# Patient Record
Sex: Male | Born: 1970 | Race: Black or African American | Hispanic: No | Marital: Married | Smoking: Current every day smoker
Health system: Southern US, Community
[De-identification: ages and names within clinical notes are randomized; demographics above are authoritative.]

## PROBLEM LIST (undated history)

## (undated) DIAGNOSIS — E785 Hyperlipidemia, unspecified: Secondary | ICD-10-CM

## (undated) DIAGNOSIS — E119 Type 2 diabetes mellitus without complications: Secondary | ICD-10-CM

## (undated) DIAGNOSIS — I10 Essential (primary) hypertension: Secondary | ICD-10-CM

## (undated) DIAGNOSIS — D649 Anemia, unspecified: Secondary | ICD-10-CM

## (undated) DIAGNOSIS — G4730 Sleep apnea, unspecified: Secondary | ICD-10-CM

## (undated) HISTORY — DX: Type 2 diabetes mellitus without complications: E11.9

## (undated) HISTORY — PX: ELBOW FRACTURE SURGERY: SHX616

## (undated) HISTORY — DX: Essential (primary) hypertension: I10

## (undated) HISTORY — DX: Hyperlipidemia, unspecified: E78.5

## (undated) HISTORY — PX: CHOLECYSTECTOMY: SHX55

---

## 2011-09-27 ENCOUNTER — Ambulatory Visit: Payer: Self-pay | Admitting: Internal Medicine

## 2020-01-09 ENCOUNTER — Encounter: Payer: Self-pay | Admitting: Gastroenterology

## 2020-03-05 ENCOUNTER — Encounter: Payer: Self-pay | Admitting: Gastroenterology

## 2020-03-30 DIAGNOSIS — U071 COVID-19: Secondary | ICD-10-CM

## 2020-03-30 HISTORY — DX: COVID-19: U07.1

## 2020-04-29 ENCOUNTER — Encounter: Payer: Self-pay | Admitting: Gastroenterology

## 2020-05-15 ENCOUNTER — Ambulatory Visit (AMBULATORY_SURGERY_CENTER): Payer: Self-pay | Admitting: *Deleted

## 2020-05-15 VITALS — Ht 73.0 in | Wt 260.0 lb

## 2020-05-15 DIAGNOSIS — Z1211 Encounter for screening for malignant neoplasm of colon: Secondary | ICD-10-CM

## 2020-05-15 NOTE — Progress Notes (Signed)
No egg or soy allergy known to patient  No issues with past sedation with any surgeries or procedures No intubation problems in the past  No FH of Malignant Hyperthermia No diet pills per patient No home 02 use per patient  No blood thinners per patient  Pt denies issues with constipation  No A fib or A flutter  EMMI video to pt or via MyChart   PV completed virtually Pt is fully vaccinated  for Covid  Pt denies loose or missing teeth, denies dentures, partials, dental implants, capped or bonded teeth  Miralax prep given.  Due to the COVID-19 pandemic we are asking patients to follow certain guidelines.  Pt aware of COVID protocols and [facility] guidelines      .

## 2020-05-29 ENCOUNTER — Other Ambulatory Visit: Payer: Self-pay

## 2020-05-29 ENCOUNTER — Ambulatory Visit (AMBULATORY_SURGERY_CENTER): Payer: PRIVATE HEALTH INSURANCE | Admitting: Gastroenterology

## 2020-05-29 ENCOUNTER — Encounter: Payer: Self-pay | Admitting: Gastroenterology

## 2020-05-29 VITALS — BP 102/78 | HR 68 | Temp 97.3°F | Resp 11 | Ht 73.0 in | Wt 260.0 lb

## 2020-05-29 DIAGNOSIS — Z1211 Encounter for screening for malignant neoplasm of colon: Secondary | ICD-10-CM

## 2020-05-29 MED ORDER — SODIUM CHLORIDE 0.9 % IV SOLN: 500.0000 mL | Freq: Once | INTRAVENOUS | Status: DC

## 2020-05-29 NOTE — Progress Notes (Signed)
Report given to PACU, vss 

## 2020-05-29 NOTE — Patient Instructions (Signed)
Information on hemorrhoids given to you today.  Resume previous diet and medication.  Repeat colonoscopy in 10 year.  YOU HAD AN ENDOSCOPIC PROCEDURE TODAY AT THE [facility]:   Refer to the procedure report that was given to you for any specific questions about what was found during the examination.  If the procedure report does not answer your questions, please call your gastroenterologist to clarify.  If you requested that your care partner not be given the details of your procedure findings, then the procedure report has been included in a sealed envelope for you to review at your convenience later.  YOU SHOULD EXPECT: Some feelings of bloating in the abdomen. Passage of more gas than usual.  Walking can help get rid of the air that was put into your GI tract during the procedure and reduce the bloating. If you had a lower endoscopy (such as a colonoscopy or flexible sigmoidoscopy) you may notice spotting of blood in your stool or on the toilet paper. If you underwent a bowel prep for your procedure, you may not have a normal bowel movement for a few days.  Please Note:  You might notice some irritation and congestion in your nose or some drainage.  This is from the oxygen used during your procedure.  There is no need for concern and it should clear up in a day or so.  SYMPTOMS TO REPORT IMMEDIATELY:   Following lower endoscopy (colonoscopy or flexible sigmoidoscopy):  Excessive amounts of blood in the stool  Significant tenderness or worsening of abdominal pains  Swelling of the abdomen that is new, acute  Fever of 100F or higher  For urgent or emergent issues, a gastroenterologist can be reached at any hour by calling [phone]. Do not use MyChart messaging for urgent concerns.    DIET:  We do recommend a small meal at first, but then you may proceed to your regular diet.  Drink plenty of fluids but you should avoid alcoholic beverages for 24 hours.  ACTIVITY:  You  should plan to take it easy for the rest of today and you should NOT DRIVE or use heavy machinery until tomorrow (because of the sedation medicines used during the test).    FOLLOW UP: Our staff will call the number listed on your records 48-72 hours following your procedure to check on you and address any questions or concerns that you may have regarding the information given to you following your procedure. If we do not reach you, we will leave a message.  We will attempt to reach you two times.  During this call, we will ask if you have developed any symptoms of COVID 19. If you develop any symptoms (ie: fever, flu-like symptoms, shortness of breath, cough etc.) before then, please call [phone].  If you test positive for Covid 19 in the 2 weeks post procedure, please call and report this information to [place].    If any biopsies were taken you will be contacted by phone or by letter within the next 1-3 weeks.  Please call us at [phone] if you have not heard about the biopsies in 3 weeks.    SIGNATURES/CONFIDENTIALITY: You and/or your care partner have signed paperwork which will be entered into your electronic medical record.  These signatures attest to the fact that that the information above on your After Visit Summary has been reviewed and is understood.  Full responsibility of the confidentiality of this discharge information lies with you and/or your  care-partner.

## 2020-05-29 NOTE — Op Note (Signed)
[facility] Patient Name: Mark Powers Procedure Date: 05/29/2020 9:16 AM MRN: [id number] Endoscopist: Rachael Fee , MD Age: 50 Referring MD:  Date of Birth: 1970-07-09 Gender: Male Account #: [id number] Procedure:                Colonoscopy Indications:              Screening for colorectal malignant neoplasm Medicines:                Monitored Anesthesia Care Procedure:                Pre-Anesthesia Assessment:                           - Prior to the procedure, a History and Physical                            was performed, and patient medications and                            allergies were reviewed. The patient's tolerance of                            previous anesthesia was also reviewed. The risks                            and benefits of the procedure and the sedation                            options and risks were discussed with the patient.                            All questions were answered, and informed consent                            was obtained. Prior Anticoagulants: The patient has                            taken no previous anticoagulant or antiplatelet                            agents. ASA Grade Assessment: II - A patient with                            mild systemic disease. After reviewing the risks                            and benefits, the patient was deemed in                            satisfactory condition to undergo the procedure.                           After obtaining informed consent, the colonoscope  was passed under direct vision. Throughout the                            procedure, the patient's blood pressure, pulse, and                            oxygen saturations were monitored continuously. The                            Olympus CF-HQ190 [id number]) Colonoscope was                            introduced through the anus and advanced to the the                            cecum, identified by  appendiceal orifice and                            ileocecal valve. The colonoscopy was performed                            without difficulty. The patient tolerated the                            procedure well. The quality of the bowel                            preparation was good. The ileocecal valve,                            appendiceal orifice, and rectum were photographed. Scope In: 9:22:25 AM Scope Out: 9:32:29 AM Scope Withdrawal Time: 0 hours 6 minutes 58 seconds  Total Procedure Duration: 0 hours 10 minutes 4 seconds  Findings:                 Internal hemorrhoids were found. The hemorrhoids                            were small.                           The exam was otherwise without abnormality on                            direct and retroflexion views. Complications:            No immediate complications. Estimated blood loss:                            None. Estimated Blood Loss:     Estimated blood loss: none. Impression:               - Internal hemorrhoids.                           - The examination was otherwise normal on direct  and retroflexion views.                           - No polyps or cancers. Recommendation:           - Patient has a contact number available for                            emergencies. The signs and symptoms of potential                            delayed complications were discussed with the                            patient. Return to normal activities tomorrow.                            Written discharge instructions were provided to the                            patient.                           - Resume previous diet.                           - Continue present medications.                           - Repeat colonoscopy in 10 years for screening. Rachael Fee, MD 05/29/2020 9:34:01 AM This report has been signed electronically.

## 2020-05-29 NOTE — Progress Notes (Signed)
[facility]  Pt's states no medical or surgical changes since previsit or office visit.  

## 2020-05-31 ENCOUNTER — Telehealth: Payer: Self-pay | Admitting: *Deleted

## 2020-05-31 NOTE — Telephone Encounter (Signed)
Follow up call made. 

## 2020-05-31 NOTE — Telephone Encounter (Signed)
Second follow up call made. 

## 2021-03-11 DIAGNOSIS — I4510 Unspecified right bundle-branch block: Secondary | ICD-10-CM

## 2021-04-25 ENCOUNTER — Ambulatory Visit: Payer: Self-pay | Admitting: Student

## 2021-04-25 ENCOUNTER — Encounter (HOSPITAL_COMMUNITY): Payer: Self-pay | Admitting: Student

## 2021-04-25 ENCOUNTER — Other Ambulatory Visit: Payer: Self-pay

## 2021-04-25 NOTE — Progress Notes (Signed)
Spoke with pt and his wife, Carollee Herter for pre-op call. Pt states he is treated for HTN and Type 2 diabetes. Denies cardiac history. Pt states his last A1C was 6.4 in November 2022. Pt states he rarely checks his blood sugar. States he does have a CBG meter. Instructed pt not to take his Jardiance Sunday and Monday AM and not take Metformin Monday AM. Instructed him to check his blood sugar when he wakes up Monday AM.  If blood sugar is 70 or below, treat with 1/2 cup of clear juice (apple or cranberry) and recheck blood sugar 15 minutes after drinking juice. If blood sugar continues to be 70 or below, call the Short Stay department and ask to speak to a nurse. He voiced understanding.   Pt will need a Covid test on arrival.

## 2021-04-27 DIAGNOSIS — S42412G Displaced simple supracondylar fracture without intercondylar fracture of left humerus, subsequent encounter for fracture with delayed healing: Secondary | ICD-10-CM
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
## 2021-04-28 ENCOUNTER — Inpatient Hospital Stay (HOSPITAL_COMMUNITY)
Admission: RE | Admit: 2021-04-28 | Discharge: 2021-05-01 | DRG: 493 | Disposition: A | Discharge status: Home Health | Payer: PRIVATE HEALTH INSURANCE | Attending: Student | Admitting: Student

## 2021-04-28 ENCOUNTER — Encounter (HOSPITAL_COMMUNITY): Payer: Self-pay | Admitting: Student

## 2021-04-28 ENCOUNTER — Inpatient Hospital Stay (HOSPITAL_COMMUNITY): Payer: PRIVATE HEALTH INSURANCE

## 2021-04-28 ENCOUNTER — Inpatient Hospital Stay (HOSPITAL_COMMUNITY): Payer: PRIVATE HEALTH INSURANCE | Admitting: Certified Registered"

## 2021-04-28 ENCOUNTER — Other Ambulatory Visit: Payer: Self-pay

## 2021-04-28 ENCOUNTER — Encounter (HOSPITAL_COMMUNITY)
Admission: RE | Disposition: A | Discharge status: Home Health | Payer: Self-pay | Source: Home / Self Care | Attending: Student

## 2021-04-28 DIAGNOSIS — M869 Osteomyelitis, unspecified: Secondary | ICD-10-CM | POA: Diagnosis present

## 2021-04-28 DIAGNOSIS — F17210 Nicotine dependence, cigarettes, uncomplicated: Secondary | ICD-10-CM | POA: Diagnosis present

## 2021-04-28 DIAGNOSIS — Z8616 Personal history of COVID-19: Secondary | ICD-10-CM | POA: Diagnosis not present

## 2021-04-28 DIAGNOSIS — D62 Acute posthemorrhagic anemia: Secondary | ICD-10-CM | POA: Diagnosis not present

## 2021-04-28 DIAGNOSIS — Z419 Encounter for procedure for purposes other than remedying health state, unspecified: Secondary | ICD-10-CM

## 2021-04-28 DIAGNOSIS — I10 Essential (primary) hypertension: Secondary | ICD-10-CM | POA: Diagnosis present

## 2021-04-28 DIAGNOSIS — T148XXA Other injury of unspecified body region, initial encounter: Secondary | ICD-10-CM

## 2021-04-28 DIAGNOSIS — T84611A Infection and inflammatory reaction due to internal fixation device of left humerus, initial encounter: Secondary | ICD-10-CM | POA: Diagnosis present

## 2021-04-28 DIAGNOSIS — X58XXXA Exposure to other specified factors, initial encounter: Secondary | ICD-10-CM | POA: Diagnosis present

## 2021-04-28 DIAGNOSIS — S42412G Displaced simple supracondylar fracture without intercondylar fracture of left humerus, subsequent encounter for fracture with delayed healing: Secondary | ICD-10-CM

## 2021-04-28 DIAGNOSIS — E1142 Type 2 diabetes mellitus with diabetic polyneuropathy: Secondary | ICD-10-CM | POA: Diagnosis present

## 2021-04-28 DIAGNOSIS — G5622 Lesion of ulnar nerve, left upper limb: Secondary | ICD-10-CM | POA: Diagnosis present

## 2021-04-28 DIAGNOSIS — Z7984 Long term (current) use of oral hypoglycemic drugs: Secondary | ICD-10-CM | POA: Diagnosis not present

## 2021-04-28 DIAGNOSIS — S42492A Other displaced fracture of lower end of left humerus, initial encounter for closed fracture: Secondary | ICD-10-CM | POA: Diagnosis present

## 2021-04-28 DIAGNOSIS — Z79899 Other long term (current) drug therapy: Secondary | ICD-10-CM

## 2021-04-28 DIAGNOSIS — K5900 Constipation, unspecified: Secondary | ICD-10-CM | POA: Diagnosis present

## 2021-04-28 DIAGNOSIS — T847XXA Infection and inflammatory reaction due to other internal orthopedic prosthetic devices, implants and grafts, initial encounter: Secondary | ICD-10-CM

## 2021-04-28 DIAGNOSIS — E785 Hyperlipidemia, unspecified: Secondary | ICD-10-CM | POA: Diagnosis present

## 2021-04-28 DIAGNOSIS — Y792 Prosthetic and other implants, materials and accessory orthopedic devices associated with adverse incidents: Secondary | ICD-10-CM | POA: Diagnosis present

## 2021-04-28 DIAGNOSIS — S52122A Displaced fracture of head of left radius, initial encounter for closed fracture: Secondary | ICD-10-CM

## 2021-04-28 HISTORY — PX: HARDWARE REMOVAL: SHX979

## 2021-04-28 HISTORY — DX: Sleep apnea, unspecified: G47.30

## 2021-04-28 HISTORY — DX: Anemia, unspecified: D64.9

## 2021-04-28 HISTORY — PX: I & D EXTREMITY: SHX5045

## 2021-04-28 LAB — BASIC METABOLIC PANEL
Anion gap: 10 (ref 5–15)
BUN: 15 mg/dL (ref 6–20)
CO2: 25 mmol/L (ref 22–32)
Calcium: 8.9 mg/dL (ref 8.9–10.3)
Chloride: 100 mmol/L (ref 98–111)
Creatinine, Ser: 0.82 mg/dL (ref 0.61–1.24)
GFR, Estimated: >60 mL/min (ref >60)
Glucose, Bld: 158 mg/dL — ABNORMAL HIGH (ref 70–99)
Potassium: 3.7 mmol/L (ref 3.5–5.1)
Sodium: 135 mmol/L (ref 135–145)

## 2021-04-28 LAB — CBC WITH DIFFERENTIAL/PLATELET
Abs Immature Granulocytes: 0.01 10*3/uL (ref 0.00–0.07)
Basophils Absolute: 0 10*3/uL (ref 0.0–0.1)
Basophils Relative: 0 %
Eosinophils Absolute: 0.1 10*3/uL (ref 0.0–0.5)
Eosinophils Relative: 1 %
HCT: 37.2 % — ABNORMAL LOW (ref 39.0–52.0)
Hemoglobin: 11.6 g/dL — ABNORMAL LOW (ref 13.0–17.0)
Immature Granulocytes: 0 %
Lymphocytes Relative: 39 %
Lymphs Abs: 2.4 10*3/uL (ref 0.7–4.0)
MCH: 28.3 pg (ref 26.0–34.0)
MCHC: 31.2 g/dL (ref 30.0–36.0)
MCV: 90.7 fL (ref 80.0–100.0)
Monocytes Absolute: 0.6 10*3/uL (ref 0.1–1.0)
Monocytes Relative: 10 %
Neutro Abs: 3.1 10*3/uL (ref 1.7–7.7)
Neutrophils Relative %: 50 %
Platelets: 387 10*3/uL (ref 150–400)
RBC: 4.1 MIL/uL — ABNORMAL LOW (ref 4.22–5.81)
RDW: 15.1 % (ref 11.5–15.5)
WBC: 6.3 10*3/uL (ref 4.0–10.5)
nRBC: 0 % (ref 0.0–0.2)

## 2021-04-28 LAB — SARS CORONAVIRUS 2 BY RT PCR (HOSPITAL ORDER, PERFORMED IN ~~LOC~~ HOSPITAL LAB): SARS Coronavirus 2: NEGATIVE

## 2021-04-28 LAB — GLUCOSE, CAPILLARY
Glucose-Capillary: 170 mg/dL — ABNORMAL HIGH (ref 70–99)
Glucose-Capillary: 136 mg/dL — ABNORMAL HIGH (ref 70–99)
Glucose-Capillary: 168 mg/dL — ABNORMAL HIGH (ref 70–99)
Glucose-Capillary: 181 mg/dL — ABNORMAL HIGH (ref 70–99)

## 2021-04-28 LAB — PROTIME-INR
INR: 0.9 (ref 0.8–1.2)
Prothrombin Time: 12.3 seconds (ref 11.4–15.2)

## 2021-04-28 LAB — C-REACTIVE PROTEIN: CRP: 1 mg/dL — ABNORMAL HIGH (ref <1.0)

## 2021-04-28 LAB — HEMOGLOBIN A1C
Hgb A1c MFr Bld: 5.8 % — ABNORMAL HIGH (ref 4.8–5.6)
Mean Plasma Glucose: 119.76 mg/dL

## 2021-04-28 LAB — VITAMIN D 25 HYDROXY (VIT D DEFICIENCY, FRACTURES): Vit D, 25-Hydroxy: 34.13 ng/mL (ref 30–100)

## 2021-04-28 LAB — SEDIMENTATION RATE: Sed Rate: 71 mm/hr — ABNORMAL HIGH (ref 0–16)

## 2021-04-28 SURGERY — IRRIGATION AND DEBRIDEMENT EXTREMITY
Anesthesia: General | Site: Elbow | Laterality: Left

## 2021-04-28 MED ORDER — FENTANYL CITRATE (PF) 250 MCG/5ML IJ SOLN
INTRAMUSCULAR | Status: AC
  Filled 2021-04-28: qty 5

## 2021-04-28 MED ORDER — DIPHENHYDRAMINE HCL 12.5 MG/5ML PO ELIX
12.5000 mg | ORAL_SOLUTION | ORAL | Status: DC | PRN
  Filled 2021-04-28: qty 10

## 2021-04-28 MED ORDER — CHLORHEXIDINE GLUCONATE 0.12 % MT SOLN
15.0000 mL | Freq: Once | OROMUCOSAL | Status: AC
  Administered 2021-04-28: 15 mL via OROMUCOSAL

## 2021-04-28 MED ORDER — ROCURONIUM BROMIDE 100 MG/10ML IV SOLN
INTRAVENOUS | Status: DC | PRN
  Administered 2021-04-28: 20 mg via INTRAVENOUS
  Administered 2021-04-28: 70 mg via INTRAVENOUS

## 2021-04-28 MED ORDER — SUCCINYLCHOLINE CHLORIDE 200 MG/10ML IV SOSY
PREFILLED_SYRINGE | INTRAVENOUS | Status: DC | PRN
  Administered 2021-04-28: 160 mg via INTRAVENOUS

## 2021-04-28 MED ORDER — HYDROCHLOROTHIAZIDE 12.5 MG PO TABS
12.5000 mg | ORAL_TABLET | Freq: Every day | ORAL | Status: DC
  Administered 2021-04-29 – 2021-05-01 (×3): 12.5 mg via ORAL
  Filled 2021-04-28 (×4): qty 1

## 2021-04-28 MED ORDER — IRBESARTAN 150 MG PO TABS
150.0000 mg | ORAL_TABLET | Freq: Every day | ORAL | Status: DC
  Administered 2021-04-29 – 2021-05-01 (×3): 150 mg via ORAL
  Filled 2021-04-28 (×4): qty 1

## 2021-04-28 MED ORDER — LACTATED RINGERS IV SOLN: INTRAVENOUS | Status: DC

## 2021-04-28 MED ORDER — ACETAMINOPHEN 500 MG PO TABS: 1000.0000 mg | ORAL_TABLET | Freq: Once | ORAL | Status: DC | PRN

## 2021-04-28 MED ORDER — VANCOMYCIN HCL 1500 MG/300ML IV SOLN
1500.0000 mg | Freq: Two times a day (BID) | INTRAVENOUS | Status: DC
  Administered 2021-04-29: 1500 mg via INTRAVENOUS
  Filled 2021-04-28: qty 300

## 2021-04-28 MED ORDER — ONDANSETRON HCL 4 MG/2ML IJ SOLN
4.0000 mg | Freq: Four times a day (QID) | INTRAMUSCULAR | Status: DC | PRN
  Filled 2021-04-28: qty 2

## 2021-04-28 MED ORDER — ONDANSETRON HCL 4 MG/2ML IJ SOLN
INTRAMUSCULAR | Status: DC | PRN
  Administered 2021-04-28: 4 mg via INTRAVENOUS

## 2021-04-28 MED ORDER — LIDOCAINE HCL (CARDIAC) PF 100 MG/5ML IV SOSY
PREFILLED_SYRINGE | INTRAVENOUS | Status: DC | PRN
Start: 2021-04-28 — End: 2021-04-28
  Administered 2021-04-28: 60 mg via INTRATRACHEAL

## 2021-04-28 MED ORDER — PROPOFOL 10 MG/ML IV BOLUS
INTRAVENOUS | Status: DC | PRN
  Administered 2021-04-28: 20 mg via INTRAVENOUS
  Administered 2021-04-28: 160 mg via INTRAVENOUS
  Administered 2021-04-28: 20 mg via INTRAVENOUS

## 2021-04-28 MED ORDER — TOBRAMYCIN SULFATE 1.2 G IJ SOLR
INTRAMUSCULAR | Status: AC
  Filled 2021-04-28: qty 1.2

## 2021-04-28 MED ORDER — ICOSAPENT ETHYL 1 G PO CAPS
2.0000 g | ORAL_CAPSULE | Freq: Two times a day (BID) | ORAL | Status: DC
  Administered 2021-04-28 – 2021-05-01 (×5): 2 g via ORAL
  Filled 2021-04-28 (×8): qty 2

## 2021-04-28 MED ORDER — ENOXAPARIN SODIUM 40 MG/0.4ML IJ SOSY
40.0000 mg | PREFILLED_SYRINGE | INTRAMUSCULAR | Status: DC
  Administered 2021-04-29 – 2021-05-01 (×2): 40 mg via SUBCUTANEOUS
  Filled 2021-04-28 (×4): qty 0.4

## 2021-04-28 MED ORDER — GABAPENTIN 300 MG PO CAPS
300.0000 mg | ORAL_CAPSULE | Freq: Two times a day (BID) | ORAL | Status: DC
  Administered 2021-04-28 – 2021-05-01 (×5): 300 mg via ORAL
  Filled 2021-04-28 (×6): qty 1

## 2021-04-28 MED ORDER — CEFTRIAXONE SODIUM 2 G IJ SOLR
2.0000 g | INTRAMUSCULAR | Status: DC
  Administered 2021-04-28: 2 g via INTRAVENOUS
  Filled 2021-04-28: qty 20

## 2021-04-28 MED ORDER — 0.9 % SODIUM CHLORIDE (POUR BTL) OPTIME
TOPICAL | Status: DC | PRN
  Administered 2021-04-28: 1000 mL

## 2021-04-28 MED ORDER — VANCOMYCIN HCL 1000 MG IV SOLR
INTRAVENOUS | Status: AC
  Filled 2021-04-28: qty 20

## 2021-04-28 MED ORDER — ACETAMINOPHEN 10 MG/ML IV SOLN
INTRAVENOUS | Status: AC
  Filled 2021-04-28: qty 100

## 2021-04-28 MED ORDER — ACETAMINOPHEN 10 MG/ML IV SOLN: 1000.0000 mg | Freq: Once | INTRAVENOUS | Status: DC | PRN

## 2021-04-28 MED ORDER — DOCUSATE SODIUM 100 MG PO CAPS
100.0000 mg | ORAL_CAPSULE | Freq: Two times a day (BID) | ORAL | Status: DC
  Administered 2021-04-28 – 2021-05-01 (×7): 100 mg via ORAL
  Filled 2021-04-28 (×7): qty 1

## 2021-04-28 MED ORDER — AMLODIPINE BESYLATE 5 MG PO TABS
5.0000 mg | ORAL_TABLET | Freq: Every day | ORAL | Status: DC
  Administered 2021-04-29 – 2021-05-01 (×3): 5 mg via ORAL
  Filled 2021-04-28 (×4): qty 1

## 2021-04-28 MED ORDER — TOBRAMYCIN SULFATE 1.2 G IJ SOLR
INTRAMUSCULAR | Status: DC | PRN
  Administered 2021-04-28: 1.2 g via TOPICAL

## 2021-04-28 MED ORDER — METHOCARBAMOL 500 MG PO TABS
ORAL_TABLET | ORAL | Status: AC
  Filled 2021-04-28: qty 1

## 2021-04-28 MED ORDER — OXYCODONE HCL 5 MG PO TABS: 5.0000 mg | ORAL_TABLET | Freq: Once | ORAL | Status: DC | PRN

## 2021-04-28 MED ORDER — FENTANYL CITRATE (PF) 100 MCG/2ML IJ SOLN
25.0000 ug | INTRAMUSCULAR | Status: DC | PRN
  Administered 2021-04-28 (×2): 25 ug via INTRAVENOUS

## 2021-04-28 MED ORDER — OXYCODONE HCL 5 MG/5ML PO SOLN: 5.0000 mg | Freq: Once | ORAL | Status: DC | PRN

## 2021-04-28 MED ORDER — OXYCODONE HCL 5 MG PO TABS
ORAL_TABLET | ORAL | Status: AC
  Filled 2021-04-28: qty 2

## 2021-04-28 MED ORDER — FENTANYL CITRATE (PF) 100 MCG/2ML IJ SOLN
INTRAMUSCULAR | Status: DC | PRN
  Administered 2021-04-28 (×4): 50 ug via INTRAVENOUS
  Administered 2021-04-28: 100 ug via INTRAVENOUS
  Administered 2021-04-28: 50 ug via INTRAVENOUS
  Administered 2021-04-28: 100 ug via INTRAVENOUS
  Administered 2021-04-28: 50 ug via INTRAVENOUS

## 2021-04-28 MED ORDER — HYDROMORPHONE HCL 1 MG/ML IJ SOLN
INTRAMUSCULAR | Status: DC | PRN
  Administered 2021-04-28 (×2): .5 mg via INTRAVENOUS

## 2021-04-28 MED ORDER — METOCLOPRAMIDE HCL 5 MG PO TABS
5.0000 mg | ORAL_TABLET | Freq: Three times a day (TID) | ORAL | Status: DC | PRN
  Filled 2021-04-28: qty 2

## 2021-04-28 MED ORDER — ACETAMINOPHEN 10 MG/ML IV SOLN
INTRAVENOUS | Status: DC | PRN
  Administered 2021-04-28: 1000 mg via INTRAVENOUS

## 2021-04-28 MED ORDER — EMPAGLIFLOZIN 25 MG PO TABS
25.0000 mg | ORAL_TABLET | Freq: Every day | ORAL | Status: DC
  Administered 2021-04-29 – 2021-05-01 (×2): 25 mg via ORAL
  Filled 2021-04-28 (×5): qty 1

## 2021-04-28 MED ORDER — ORAL CARE MOUTH RINSE: 15.0000 mL | Freq: Once | OROMUCOSAL | Status: AC

## 2021-04-28 MED ORDER — POLYETHYLENE GLYCOL 3350 17 G PO PACK
17.0000 g | PACK | Freq: Every day | ORAL | Status: DC | PRN
  Filled 2021-04-28: qty 1

## 2021-04-28 MED ORDER — HYDROMORPHONE HCL 1 MG/ML IJ SOLN
0.5000 mg | INTRAMUSCULAR | Status: DC | PRN
  Administered 2021-04-28 – 2021-04-30 (×2): 1 mg via INTRAVENOUS
  Filled 2021-04-28: qty 1

## 2021-04-28 MED ORDER — INSULIN ASPART 100 UNIT/ML IJ SOLN
0.0000 [IU] | Freq: Every day | INTRAMUSCULAR | Status: DC
  Filled 2021-04-28: qty 0.05

## 2021-04-28 MED ORDER — CEFAZOLIN SODIUM-DEXTROSE 2-3 GM-%(50ML) IV SOLR
INTRAVENOUS | Status: DC | PRN
Start: 2021-04-28 — End: 2021-04-28
  Administered 2021-04-28: 2 g via INTRAVENOUS

## 2021-04-28 MED ORDER — SUGAMMADEX SODIUM 200 MG/2ML IV SOLN
INTRAVENOUS | Status: DC | PRN
  Administered 2021-04-28: 200 mg via INTRAVENOUS

## 2021-04-28 MED ORDER — ACETAMINOPHEN 160 MG/5ML PO SOLN: 1000.0000 mg | Freq: Once | ORAL | Status: DC | PRN

## 2021-04-28 MED ORDER — OXYCODONE HCL 5 MG PO TABS
5.0000 mg | ORAL_TABLET | ORAL | Status: DC | PRN
  Administered 2021-04-28 – 2021-04-30 (×4): 10 mg via ORAL
  Filled 2021-04-28 (×3): qty 2

## 2021-04-28 MED ORDER — ACETAMINOPHEN 500 MG PO TABS
1000.0000 mg | ORAL_TABLET | Freq: Four times a day (QID) | ORAL | Status: AC
  Administered 2021-04-28 – 2021-04-29 (×3): 1000 mg via ORAL
  Filled 2021-04-28 (×4): qty 2

## 2021-04-28 MED ORDER — PROPOFOL 10 MG/ML IV BOLUS
INTRAVENOUS | Status: AC
  Filled 2021-04-28: qty 20

## 2021-04-28 MED ORDER — VANCOMYCIN HCL 1000 MG IV SOLR
INTRAVENOUS | Status: DC | PRN
  Administered 2021-04-28: 1000 mg via TOPICAL

## 2021-04-28 MED ORDER — DEXAMETHASONE SODIUM PHOSPHATE 10 MG/ML IJ SOLN
INTRAMUSCULAR | Status: AC
  Filled 2021-04-28: qty 1

## 2021-04-28 MED ORDER — ONDANSETRON HCL 4 MG PO TABS
4.0000 mg | ORAL_TABLET | Freq: Four times a day (QID) | ORAL | Status: DC | PRN
  Filled 2021-04-28: qty 1

## 2021-04-28 MED ORDER — VANCOMYCIN HCL 2000 MG/400ML IV SOLN
2000.0000 mg | Freq: Once | INTRAVENOUS | Status: AC
  Administered 2021-04-28: 2000 mg via INTRAVENOUS
  Filled 2021-04-28: qty 400

## 2021-04-28 MED ORDER — CEFTRIAXONE SODIUM 2 G IJ SOLR: 2.0000 g | INTRAMUSCULAR | Status: DC

## 2021-04-28 MED ORDER — HYDROMORPHONE HCL 1 MG/ML IJ SOLN
INTRAMUSCULAR | Status: AC
  Filled 2021-04-28: qty 0.5

## 2021-04-28 MED ORDER — DEXAMETHASONE SODIUM PHOSPHATE 10 MG/ML IJ SOLN
INTRAMUSCULAR | Status: DC | PRN
  Administered 2021-04-28: 5 mg via INTRAVENOUS

## 2021-04-28 MED ORDER — ONDANSETRON HCL 4 MG/2ML IJ SOLN
INTRAMUSCULAR | Status: AC
  Filled 2021-04-28: qty 2

## 2021-04-28 MED ORDER — METFORMIN HCL 500 MG PO TABS
1000.0000 mg | ORAL_TABLET | Freq: Two times a day (BID) | ORAL | Status: DC
  Administered 2021-04-29 – 2021-05-01 (×3): 1000 mg via ORAL
  Filled 2021-04-28 (×5): qty 2

## 2021-04-28 MED ORDER — INSULIN ASPART 100 UNIT/ML IJ SOLN
0.0000 [IU] | Freq: Three times a day (TID) | INTRAMUSCULAR | Status: DC
  Administered 2021-04-28: 3 [IU] via SUBCUTANEOUS
  Administered 2021-04-29: 2 [IU] via SUBCUTANEOUS
  Administered 2021-04-29 (×2): 3 [IU] via SUBCUTANEOUS
  Administered 2021-04-30: 2 [IU] via SUBCUTANEOUS
  Administered 2021-05-01: 5 [IU] via SUBCUTANEOUS
  Administered 2021-05-01: 2 [IU] via SUBCUTANEOUS
  Filled 2021-04-28: qty 0.15

## 2021-04-28 MED ORDER — CELECOXIB 200 MG PO CAPS
200.0000 mg | ORAL_CAPSULE | Freq: Two times a day (BID) | ORAL | Status: DC
  Administered 2021-04-28 – 2021-05-01 (×5): 200 mg via ORAL
  Filled 2021-04-28 (×7): qty 1

## 2021-04-28 MED ORDER — SODIUM CHLORIDE 0.9 % IR SOLN
Status: DC | PRN
  Administered 2021-04-28: 3000 mL

## 2021-04-28 MED ORDER — MIDAZOLAM HCL 2 MG/2ML IJ SOLN
INTRAMUSCULAR | Status: AC
  Filled 2021-04-28: qty 2

## 2021-04-28 MED ORDER — CYCLOBENZAPRINE HCL 10 MG PO TABS
10.0000 mg | ORAL_TABLET | Freq: Three times a day (TID) | ORAL | Status: DC | PRN
  Administered 2021-04-28 – 2021-05-01 (×2): 10 mg via ORAL
  Filled 2021-04-28 (×4): qty 1

## 2021-04-28 MED ORDER — HYDRALAZINE HCL 10 MG PO TABS
10.0000 mg | ORAL_TABLET | Freq: Four times a day (QID) | ORAL | Status: DC | PRN
  Filled 2021-04-28: qty 1

## 2021-04-28 MED ORDER — VALSARTAN-HYDROCHLOROTHIAZIDE 160-12.5 MG PO TABS: 1.0000 | ORAL_TABLET | Freq: Every day | ORAL | Status: DC

## 2021-04-28 MED ORDER — HYDROMORPHONE HCL 1 MG/ML IJ SOLN
INTRAMUSCULAR | Status: AC
  Filled 2021-04-28: qty 1

## 2021-04-28 MED ORDER — CYCLOBENZAPRINE HCL 10 MG PO TABS
ORAL_TABLET | ORAL | Status: AC
  Administered 2021-04-28: 10 mg via ORAL
  Filled 2021-04-28: qty 1

## 2021-04-28 MED ORDER — OXYCODONE HCL 5 MG PO TABS
10.0000 mg | ORAL_TABLET | ORAL | Status: DC | PRN
  Administered 2021-04-28: 15 mg via ORAL
  Administered 2021-04-29: 10 mg via ORAL
  Filled 2021-04-28: qty 3
  Filled 2021-04-28: qty 2

## 2021-04-28 MED ORDER — SODIUM CHLORIDE 0.9 % IV SOLN: INTRAVENOUS | Status: DC

## 2021-04-28 MED ORDER — FENTANYL CITRATE (PF) 100 MCG/2ML IJ SOLN
INTRAMUSCULAR | Status: AC
  Filled 2021-04-28: qty 2

## 2021-04-28 MED ORDER — CEFAZOLIN SODIUM 1 G IJ SOLR
INTRAMUSCULAR | Status: AC
  Filled 2021-04-28: qty 20

## 2021-04-28 MED ORDER — METOCLOPRAMIDE HCL 5 MG/ML IJ SOLN
5.0000 mg | Freq: Three times a day (TID) | INTRAMUSCULAR | Status: DC | PRN
  Filled 2021-04-28: qty 2

## 2021-04-28 SURGICAL SUPPLY — 79 items
BAG COUNTER SPONGE SURGICOUNT (BAG) ×3 IMPLANT
BANDAGE ESMARK 6X9 LF (GAUZE/BANDAGES/DRESSINGS) ×1 IMPLANT
BNDG COHESIVE 4X5 TAN STRL (GAUZE/BANDAGES/DRESSINGS) ×3 IMPLANT
BNDG COHESIVE 6X5 TAN STRL LF (GAUZE/BANDAGES/DRESSINGS) ×1 IMPLANT
BNDG ELASTIC 4X5.8 VLCR STR LF (GAUZE/BANDAGES/DRESSINGS) ×3 IMPLANT
BNDG ELASTIC 6X5.8 VLCR STR LF (GAUZE/BANDAGES/DRESSINGS) ×3 IMPLANT
BNDG ESMARK 6X9 LF (GAUZE/BANDAGES/DRESSINGS)
BNDG GAUZE ELAST 4 BULKY (GAUZE/BANDAGES/DRESSINGS) ×2 IMPLANT
BRUSH SCRUB EZ PLAIN DRY (MISCELLANEOUS) ×4 IMPLANT
CANISTER WOUNDNEG PRESSURE 500 (CANNISTER) ×2 IMPLANT
CHLORAPREP W/TINT 26 (MISCELLANEOUS) ×3 IMPLANT
CNTNR URN SCR LID CUP LEK RST (MISCELLANEOUS) ×2 IMPLANT
CONT SPEC 4OZ STRL OR WHT (MISCELLANEOUS) ×2
COVER MAYO STAND STRL (DRAPES) ×1 IMPLANT
COVER SURGICAL LIGHT HANDLE (MISCELLANEOUS) ×4 IMPLANT
CUFF TOURN SGL QUICK 18X4 (TOURNIQUET CUFF) IMPLANT
CUFF TOURN SGL QUICK 24 (TOURNIQUET CUFF)
CUFF TOURN SGL QUICK 34 (TOURNIQUET CUFF)
CUFF TRNQT CYL 24X4X16.5-23 (TOURNIQUET CUFF) IMPLANT
CUFF TRNQT CYL 34X4.125X (TOURNIQUET CUFF) IMPLANT
DRAPE C-ARM 42X72 X-RAY (DRAPES) ×2 IMPLANT
DRAPE C-ARMOR (DRAPES) ×1 IMPLANT
DRAPE ORTHO SPLIT 77X108 STRL (DRAPES) ×2
DRAPE SURG 17X23 STRL (DRAPES) ×1 IMPLANT
DRAPE SURG ORHT 6 SPLT 77X108 (DRAPES) ×3 IMPLANT
DRAPE U-SHAPE 47X51 STRL (DRAPES) ×3 IMPLANT
DRESSING PREVENA PLUS CUSTOM (GAUZE/BANDAGES/DRESSINGS) ×1 IMPLANT
DRSG ADAPTIC 3X8 NADH LF (GAUZE/BANDAGES/DRESSINGS) ×1 IMPLANT
DRSG PREVENA PLUS CUSTOM (GAUZE/BANDAGES/DRESSINGS) ×3
ELECT REM PT RETURN 9FT ADLT (ELECTROSURGICAL) ×3
ELECTRODE REM PT RTRN 9FT ADLT (ELECTROSURGICAL) ×2 IMPLANT
EVACUATOR 1/8 PVC DRAIN (DRAIN) IMPLANT
GAUZE SPONGE 4X4 12PLY STRL (GAUZE/BANDAGES/DRESSINGS) ×3 IMPLANT
GLOVE SURG ENC MOIS LTX SZ6.5 (GLOVE) ×9 IMPLANT
GLOVE SURG ENC MOIS LTX SZ7.5 (GLOVE) ×12 IMPLANT
GLOVE SURG UNDER POLY LF SZ6.5 (GLOVE) ×3 IMPLANT
GLOVE SURG UNDER POLY LF SZ7.5 (GLOVE) ×3 IMPLANT
GOWN STRL REUS W/ TWL LRG LVL3 (GOWN DISPOSABLE) ×4 IMPLANT
GOWN STRL REUS W/TWL LRG LVL3 (GOWN DISPOSABLE) ×2
HANDPIECE INTERPULSE COAX TIP (DISPOSABLE) ×1
KIT BASIN OR (CUSTOM PROCEDURE TRAY) ×3 IMPLANT
KIT TURNOVER KIT B (KITS) ×3 IMPLANT
MANIFOLD NEPTUNE II (INSTRUMENTS) ×3 IMPLANT
NEEDLE 22X1 1/2 (OR ONLY) (NEEDLE) IMPLANT
NS IRRIG 1000ML POUR BTL (IV SOLUTION) ×3 IMPLANT
PACK ORTHO EXTREMITY (CUSTOM PROCEDURE TRAY) ×3 IMPLANT
PAD ARMBOARD 7.5X6 YLW CONV (MISCELLANEOUS) ×8 IMPLANT
PAD CAST 4YDX4 CTTN HI CHSV (CAST SUPPLIES) ×1 IMPLANT
PADDING CAST COTTON 4X4 STRL (CAST SUPPLIES) ×1
PADDING CAST COTTON 6X4 STRL (CAST SUPPLIES) ×5 IMPLANT
SET HNDPC FAN SPRY TIP SCT (DISPOSABLE) ×1 IMPLANT
SLING ARM IMMOBILIZER XL (CAST SUPPLIES) ×2 IMPLANT
SPLINT PLASTER CAST XFAST 5X30 (CAST SUPPLIES) ×1 IMPLANT
SPLINT PLASTER XFAST SET 5X30 (CAST SUPPLIES) ×1
SPONGE T-LAP 18X18 ~~LOC~~+RFID (SPONGE) ×7 IMPLANT
STAPLER VISISTAT 35W (STAPLE) IMPLANT
STOCKINETTE IMPERVIOUS LG (DRAPES) ×3 IMPLANT
STRIP CLOSURE SKIN 1/2X4 (GAUZE/BANDAGES/DRESSINGS) IMPLANT
SUCTION FRAZIER HANDLE 10FR (MISCELLANEOUS) ×1
SUCTION TUBE FRAZIER 10FR DISP (MISCELLANEOUS) ×1 IMPLANT
SUT ETHILON 2 0 FS 18 (SUTURE) ×2 IMPLANT
SUT ETHILON 2 0 PSLX (SUTURE) ×6 IMPLANT
SUT ETHILON 3 0 PS 1 (SUTURE) ×2 IMPLANT
SUT MNCRL AB 3-0 PS2 18 (SUTURE) ×1 IMPLANT
SUT MON AB 2-0 CT1 36 (SUTURE) ×1 IMPLANT
SUT PDS AB 0 CT 36 (SUTURE) IMPLANT
SUT PDS AB 2-0 CT1 27 (SUTURE) IMPLANT
SUT VIC AB 0 CT1 27 (SUTURE)
SUT VIC AB 0 CT1 27XBRD ANBCTR (SUTURE) IMPLANT
SUT VIC AB 2-0 CT1 27 (SUTURE)
SUT VIC AB 2-0 CT1 TAPERPNT 27 (SUTURE) IMPLANT
SWAB CULTURE ESWAB REG 1ML (MISCELLANEOUS) ×2 IMPLANT
SYR CONTROL 10ML LL (SYRINGE) IMPLANT
TOWEL GREEN STERILE (TOWEL DISPOSABLE) ×6 IMPLANT
TOWEL GREEN STERILE FF (TOWEL DISPOSABLE) ×6 IMPLANT
TUBE CONNECTING 12X1/4 (SUCTIONS) ×3 IMPLANT
UNDERPAD 30X36 HEAVY ABSORB (UNDERPADS AND DIAPERS) ×3 IMPLANT
WATER STERILE IRR 1000ML POUR (IV SOLUTION) ×6 IMPLANT
YANKAUER SUCT BULB TIP NO VENT (SUCTIONS) ×3 IMPLANT

## 2021-04-28 NOTE — Progress Notes (Signed)
Called pharmacy to inform them about a gabapentin that is missing out of a sealed tab. Of bagged med that were brought up with pt from PACU. This nurse spoke to [place] in pharmacy and was told to place in credit bin.

## 2021-04-28 NOTE — Interval H&P Note (Signed)
History and Physical Interval Note:  04/28/2021 8:45 AM  Mark Powers  has presented today for surgery, with the diagnosis of Left elbow postoperative infection.  The various methods of treatment have been discussed with the patient and family. After consideration of risks, benefits and other options for treatment, the patient has consented to  Procedure(s): IRRIGATION AND DEBRIDEMENT EXTREMITY (Left) HARDWARE  REMOVAL LEFT ELBOW (Left) REMOVAL EXTERNAL FIXATION ARM (Left) as a surgical intervention.  The patient's history has been reviewed, patient examined, no change in status, stable for surgery.  I have reviewed the patient's chart and labs.  Questions were answered to the patient's satisfaction.     Caryn Bee P Mattheo Swindle

## 2021-04-28 NOTE — Transfer of Care (Signed)
Immediate Anesthesia Transfer of Care Note  Patient: Mark Powers  Procedure(s) Performed: IRRIGATION AND DEBRIDEMENT EXTREMITY (Left: Elbow) HARDWARE  REMOVAL LEFT ELBOW (Left: Elbow)  Patient Location: PACU  Anesthesia Type:General  Level of Consciousness: drowsy and patient cooperative  Airway & Oxygen Therapy: Patient Spontanous Breathing  Post-op Assessment: Report given to RN and Post -op Vital signs reviewed and stable  Post vital signs: Reviewed and stable  Last Vitals:  Vitals Value Taken Time  BP 146/95 04/28/21 1146  Temp    Pulse 82 04/28/21 1151  Resp 24 04/28/21 1151  SpO2 100 % 04/28/21 1151  Vitals shown include unvalidated device data.  Last Pain:  Vitals:   04/28/21 0701  TempSrc:   PainSc: 5       Patients Stated Pain Goal: 0 (04/28/21 0701)  Complications: No notable events documented.

## 2021-04-28 NOTE — Progress Notes (Signed)
Pharmacy Antibiotic Note  Mark Powers is a 51 y.o. 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 He is s/p I and D today with complete hardware removal. Pharmacy has been consulted for vancomycin dosing. SCr today is 0.82 with CrCl > 100 ml/min.   Plan: Vancomycin 2000 mg x 1 then 1500 mg every 12 hours  Predicted AUC 496 with Scr 0.82 and Vd 0.5  Monitor culture data from the OR and renal function   Height: 6\' 1"  (185.4 cm) Weight: 115.7 kg (255 lb) IBW/kg (Calculated) : 79.9  Temp (24hrs), Avg:97.9 F (36.6 C), Min:97.8 F (36.6 C), Max:97.9 F (36.6 C)  Recent Labs  Lab 04/28/21 0736  WBC 6.3  CREATININE 0.82    Estimated Creatinine Clearance: 142 mL/min (by C-G formula based on SCr of 0.82 mg/dL).    No Known Allergies  Antimicrobials this admission: 1/30 Vanc> 1/30  Ceftriaxone >>    Microbiology results: 1/30 OR Cultures:   Thank you for allowing pharmacy to be a part of this patients care.  Jimmy Footman, PharmD, BCPS, BCIDP Infectious Diseases Clinical Pharmacist Phone: [phone] 04/28/2021 2:08 PM
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
## 2021-04-28 NOTE — Anesthesia Procedure Notes (Signed)
Procedure Name: Intubation Date/Time: 04/28/2021 9:37 AM Performed by: Marny Lowenstein, CRNA Pre-anesthesia Checklist: Patient identified, Emergency Drugs available, Suction available and Patient being monitored Patient Re-evaluated:Patient Re-evaluated prior to induction Oxygen Delivery Method: Circle system utilized Preoxygenation: Pre-oxygenation with 100% oxygen Induction Type: IV induction, Rapid sequence and Cricoid Pressure applied Laryngoscope Size: Miller and 3 Grade View: Grade I Tube type: Oral Tube size: 8.0 mm Number of attempts: 1 Airway Equipment and Method: Patient positioned with wedge pillow and Stylet Placement Confirmation: ETT inserted through vocal cords under direct vision, positive ETCO2 and breath sounds checked- equal and bilateral Secured at: 24 cm Tube secured with: Tape Dental Injury: Teeth and Oropharynx as per pre-operative assessment  Comments: Pt taking ozempic with last dose less then 7 days ago; RSI for airway protection

## 2021-04-28 NOTE — Anesthesia Preprocedure Evaluation (Signed)
Anesthesia Evaluation  Patient identified by MRN, date of birth, ID band Patient awake    Reviewed: Allergy & Precautions, NPO status , Patient's Chart, lab work & pertinent test results  History of Anesthesia Complications Negative for: history of anesthetic complications  Airway Mallampati: II  TM Distance: >3 FB Neck ROM: Full    Dental  (+) Dental Advisory Given, Teeth Intact   Pulmonary neg shortness of breath, neg sleep apnea, neg COPD, neg recent URI, Current Smoker and Patient abstained from smoking.,    breath sounds clear to auscultation       Cardiovascular hypertension, Pt. on medications (-) angina(-) Past MI and (-) CHF  Rhythm:Regular     Neuro/Psych negative neurological ROS  negative psych ROS   GI/Hepatic negative GI ROS, Neg liver ROS,   Endo/Other  diabetesNo results found for: HGBA1C   Renal/GU negative Renal ROS     Musculoskeletal Left elbow postoperative infection   Abdominal   Peds  Hematology  (+) Blood dyscrasia, anemia , Lab Results      Component                Value               Date                      WBC                      6.3                 04/28/2021                HGB                      11.6 (L)            04/28/2021                HCT                      37.2 (L)            04/28/2021                MCV                      90.7                04/28/2021                PLT                      387                 04/28/2021              Anesthesia Other Findings   Reproductive/Obstetrics                             Anesthesia Physical Anesthesia Plan  ASA: 2  Anesthesia Plan: General   Post-op Pain Management: Toradol IV (intra-op) and Ofirmev IV (intra-op)   Induction: Intravenous  PONV Risk Score and Plan: 1 and Ondansetron and Dexamethasone  Airway Management Planned: Oral ETT and LMA  Additional Equipment: None  Intra-op Plan:    Post-operative Plan: Extubation in OR  Informed Consent: I have reviewed  the patients History and Physical, chart, labs and discussed the procedure including the risks, benefits and alternatives for the proposed anesthesia with the patient or authorized representative who has indicated his/her understanding and acceptance.     Dental advisory given  Plan Discussed with: CRNA and Anesthesiologist  Anesthesia Plan Comments:         Anesthesia Quick Evaluation

## 2021-04-28 NOTE — Op Note (Addendum)
Orthopaedic Surgery Operative Note (CSN: [id number] ) Date of Surgery: 04/28/2021  Admit Date: 04/28/2021   Diagnoses: Pre-Op Diagnoses: Infected impending malunion left distal humerus Left radial head fracture/dislocation Left ulnar nerve neuropathy  Post-Op Diagnosis: Same  Procedures: CPT 64718-Decompression of left ulnar nerve CPT 23935-Incision and drainage of left infected supracondylar humerus fracture CPT 20680-Removal of hardware left distal humerus  Surgeons : Primary: Roby Lofts, MD  Assistant: Ulyses Southward, PA-C  Location: OR 3   Anesthesia:General  Antibiotics: Ancef 2g after intraoperative cultures and 1 gm vancomycin and 1.2gm tobramycin powder placed topically   Tourniquet time:None   Estimated Blood Loss:100 mL  Complications:None   Specimens: ID Type Source Tests Collected by Time Destination  A : Left Arm Drainage Body Fluid PATH Other AEROBIC/ANAEROBIC CULTURE W GRAM STAIN (SURGICAL/DEEP WOUND), ANAEROBIC CULTURE W GRAM STAIN (Canceled) Zayana Salvador, Gillie Manners, MD 04/28/2021 1000   B : Left Humerus Infection Tissue PATH Soft tissue resection AEROBIC/ANAEROBIC CULTURE W GRAM STAIN (SURGICAL/DEEP WOUND), ANAEROBIC CULTURE W GRAM STAIN (Canceled) Jaidan Stachnik, Gillie Manners, MD 04/28/2021 1029   C : Fracture Site Tissue PATH Soft tissue resection AEROBIC/ANAEROBIC CULTURE W GRAM STAIN (SURGICAL/DEEP WOUND), ANAEROBIC CULTURE W GRAM STAIN (Canceled) Roby Lofts, MD 04/28/2021 1047      Implants: * No implants in log *   Indications for Surgery: 51 year old male who sustained a left open distal humerus fracture and a radial head fracture dislocation in December.  He underwent irrigation debridement with open reduction internal fixation of his distal humerus.  He had persistent dislocation of his radial head.  He had been draining for approximately 4 weeks prior to being emergently referred to me.  I reviewed his imaging clinical course and I felt that he required  emergent/urgent debridement for removal of hardware and deep cultures for IV antibiotics with staged plan revision and fixation of his radial head.  Risks and benefits were discussed with the patient and his wife.  Risks included but not limited to bleeding, infection, persistent malunion, nonunion, hardware failure, hardware irritation, nerve or blood vessel injury, elbow stiffness, posttraumatic arthritis, DVT, even the possibility of anesthetic complications.  Patient agreed to proceed with surgery and consent was obtained.  Operative Findings: 1.  Irrigation debridement of left infected impending malunion of left supracondylar distal humerus fracture.  Infection probes directly to hardware and fracture site.  The bone appeared stripped of all soft tissue from the open fracture but did not have any signs of bone erosion. 2.  Decompression of ulnar nerve from proximal of the fracture site to the cubital tunnel.  Areas of compression noted at the cubital tunnel and the exit site through the triceps from the open fracture.  No significant damage noted to the nerve. 3.  Removal of hardware from left distal humerus without deep cultures taken.  Procedure: The patient was identified in the preoperative holding area. Consent was confirmed with the patient and their family and all questions were answered. The operative extremity was marked after confirmation with the patient. he was then brought back to the operating room by our anesthesia colleagues.  He was carefully transferred over to a radiolucent flat top table.  He was placed under general anesthetic.  He was placed in lateral decubitus position with the left side up.  All bony prominences were well-padded.  An axillary roll was placed to keep pressure off his neurovascular structures.  The left upper extremity was then prepped and draped in usual sterile fashion.  A timeout  was performed to verify the patient, the procedure, and the extremity.   Preoperative antibiotics were held due to intraoperative cultures that were planned.  There was a lateral traumatic laceration that was draining purulent material as well as the proximal portion of the surgical incision was draining as well.  I used a culture swab to send the drainage for microbiology.  I then reopened the surgical incision and carried this down through skin and subcutaneous tissue.  I developed a flaps both medially and laterally.  It appeared that the fracture was fixed through a triceps splitting approach.  There was devitalized muscle around the triceps along with fluid and purulent material that extended all the way down to the hardware and the nonunion site.  I provisionally irrigated this area and sent some of this for culture.  I then developed skin flaps medially and laterally had to be able to access the lateral and medial intermuscular septum.  I then worked on dissecting out the ulnar nerve.  I carefully decompressed it.  There was areas of compression at the cubital tunnel which I released it in situ at the location of the cubital tunnel.  I also dissected out of large amount of scar in the location of the triceps split where the bone had come through the muscle.  I was able to free up the nerve where there is no compression after I was finished.  I then dissected the lateral intermuscular septum to be able to access the hardware.  I found the lateral antebrachial cutaneous nerve and traced this back to the muscle belly of the triceps.  The radial nerve was not visualized.  I did not feel that I need to dissect any further out proximal to be able to visualize it.  I then proceeded to remove the screws from the 2 posterior plates.  The plates were removed without difficulty.  I then cleaned out the fracture callus and hematoma.  There is no significant pus at the fracture site but there was fibrinous tissue that was cleaned and sent to lab.  I used a curette to debride the fracture  nonunion site.  I then thoroughly irrigated the wound with approximately 3 L of normal saline.  I changed gloves and instruments afterwards and then I proceeded to extend the surgical incision and lift up the lateral skin flap for preparation for addressing the radial head and neck at the next stage.  1 g of vancomycin powder 1.2 g of tobramycin powder were placed into the nonunion site.  The skin was closed with 2-0 nylon.  A incisional Prevena VAC was placed.  The patient was placed in a long-arm splint.  He was awoken from anesthesia and taken to the PACU in stable condition.   Debridement type: Excisional Debridement  Side: left  Body Location: Elbow  Tools used for debridement: scalpel, scissors, curette, and rongeur  Pre-debridement Wound size (cm):   N/A  Post-debridement Wound size (cm):   N/A  Debridement depth beyond dead/damaged tissue down to healthy viable tissue: yes  Tissue layer involved: skin, subcutaneous tissue, muscle / fascia  Nature of tissue removed: Slough, Devitalized Tissue, and Non-viable tissue  Irrigation volume: 3L     Irrigation fluid type: Normal Saline  Post Op Plan/Instructions: The patient will be admitted for broad-spectrum IV antibiotics while we await cultures.  We will plan for infectious disease consult.  We will obtain a CT scan of his elbow to assess the radial head and neck.  We will plan to return to the operating room in 48 to 72 hours for revision fixation of his distal humerus and surgical fixation of his radial head.  He can be placed on Lovenox for DVT prophylaxis.  I was present and performed the entire surgery.  Ulyses SouthwardSarah Yacobi, PA-C did assist me throughout the case. An assistant was necessary given the difficulty in approach, maintenance of reduction and ability to instrument the fracture.   Truitt MerleKevin Ottie Neglia, MD [facility]

## 2021-04-28 NOTE — Plan of Care (Signed)
  Problem: Activity: Goal: Risk for activity intolerance will decrease Outcome: Progressing   Problem: Coping: Goal: Level of anxiety will decrease Outcome: Progressing   Problem: Pain Managment: Goal: General experience of comfort will improve Outcome: Progressing   Problem: Safety: Goal: Ability to remain free from injury will improve Outcome: Progressing   

## 2021-04-29 ENCOUNTER — Encounter (HOSPITAL_COMMUNITY): Payer: Self-pay | Admitting: Student

## 2021-04-29 ENCOUNTER — Inpatient Hospital Stay (HOSPITAL_COMMUNITY): Payer: PRIVATE HEALTH INSURANCE

## 2021-04-29 DIAGNOSIS — S42412G Displaced simple supracondylar fracture without intercondylar fracture of left humerus, subsequent encounter for fracture with delayed healing: Secondary | ICD-10-CM | POA: Diagnosis not present

## 2021-04-29 LAB — CBC
HCT: 33.5 % — ABNORMAL LOW (ref 39.0–52.0)
Hemoglobin: 10.7 g/dL — ABNORMAL LOW (ref 13.0–17.0)
MCH: 28.8 pg (ref 26.0–34.0)
MCHC: 31.9 g/dL (ref 30.0–36.0)
MCV: 90.3 fL (ref 80.0–100.0)
Platelets: 389 10*3/uL (ref 150–400)
RBC: 3.71 MIL/uL — ABNORMAL LOW (ref 4.22–5.81)
RDW: 14.9 % (ref 11.5–15.5)
WBC: 7.3 10*3/uL (ref 4.0–10.5)
nRBC: 0 % (ref 0.0–0.2)

## 2021-04-29 LAB — GLUCOSE, CAPILLARY
Glucose-Capillary: 151 mg/dL — ABNORMAL HIGH (ref 70–99)
Glucose-Capillary: 146 mg/dL — ABNORMAL HIGH (ref 70–99)
Glucose-Capillary: 162 mg/dL — ABNORMAL HIGH (ref 70–99)
Glucose-Capillary: 134 mg/dL — ABNORMAL HIGH (ref 70–99)

## 2021-04-29 LAB — BASIC METABOLIC PANEL
Anion gap: 13 (ref 5–15)
BUN: 17 mg/dL (ref 6–20)
CO2: 22 mmol/L (ref 22–32)
Calcium: 8.3 mg/dL — ABNORMAL LOW (ref 8.9–10.3)
Chloride: 99 mmol/L (ref 98–111)
Creatinine, Ser: 0.91 mg/dL (ref 0.61–1.24)
GFR, Estimated: >60 mL/min (ref >60)
Glucose, Bld: 183 mg/dL — ABNORMAL HIGH (ref 70–99)
Potassium: 4 mmol/L (ref 3.5–5.1)
Sodium: 134 mmol/L — ABNORMAL LOW (ref 135–145)

## 2021-04-29 LAB — SURGICAL PCR SCREEN
MRSA, PCR: NEGATIVE
Staphylococcus aureus: NEGATIVE

## 2021-04-29 MED ORDER — SODIUM CHLORIDE 0.9 % IV SOLN
2.0000 g | Freq: Three times a day (TID) | INTRAVENOUS | Status: DC
  Administered 2021-04-29 – 2021-05-01 (×7): 2 g via INTRAVENOUS
  Filled 2021-04-29 (×7): qty 2

## 2021-04-29 NOTE — Evaluation (Signed)
Occupational Therapy Evaluation Patient Details Name: Mark Powers MRN: [id number] DOB: 1970-11-03 Today's Date: 04/29/2021   History of Present Illness Pt is a 51 y.o. male admitted 04/28/21 with multiple weeks of persistent drainage at LUE ORIF site with concern for infection and sepsis. S/p decompression L ulnar nerve, L humerus fx I&D with removal of hardware on 1/30. Plan for return to OR for revision fixation of distal humerus and surgical fixation of radial head. PMH includes HTN, DM, OSA, anemia, COVID (03/2020).   Clinical Impression   Pt reports independence at baseline with ADLs and functional mobility. Currently awaiting surgery for hardware reimplantation. Pt currently presenting with LUE impairments, difficult to assess due to immobilization. Per PA pt ok for hand and shoulder movement as tolerated. Pt demonstrates weak grasp and opposition in L hand, decreased sensation noted on dorsum of 5th digit. Pt able to perform shoulder elevation and ~20 degrees of abduction. Pt presenting with impairments listed below, will continue to follow acutely.     Recommendations for follow up therapy are one component of a multi-disciplinary discharge planning process, led by the attending physician.  Recommendations may be updated based on patient status, additional functional criteria and insurance authorization.   Follow Up Recommendations  Follow physician's recommendations for discharge plan and follow up therapies    Assistance Recommended at Discharge Set up Supervision/Assistance  Patient can return home with the following A little help with bathing/dressing/bathroom;Assistance with cooking/housework;Assist for transportation    Functional Status Assessment  Patient has had a recent decline in their functional status and demonstrates the ability to make significant improvements in function in a reasonable and predictable amount of time.  Equipment Recommendations       Recommendations  for Other Services       Precautions / Restrictions Precautions Precautions: Fall Precaution Comments: 1/31 per PA: pt ok for hand and shoulder ROM as tolerated Required Braces or Orthoses: Sling;Splint/Cast Restrictions Weight Bearing Restrictions: Yes LUE Weight Bearing: Non weight bearing      Mobility Bed Mobility               General bed mobility comments: up in chair upon arrival    Transfers Overall transfer level: Modified independent                 General transfer comment: pt seen ambulating in hallway with PT right before session      Balance Overall balance assessment: No apparent balance deficits (not formally assessed)                                         ADL either performed or assessed with clinical judgement   ADL Overall ADL's : Needs assistance/impaired Eating/Feeding: Independent;Sitting   Grooming: Independent;Sitting   Upper Body Bathing: Minimal assistance;Sitting   Lower Body Bathing: Minimal assistance;Sitting/lateral leans   Upper Body Dressing : Minimal assistance;Sitting   Lower Body Dressing: Moderate assistance;Minimal assistance;Sit to/from stand   Toilet Transfer: Supervision/safety;Ambulation;Regular Social workerToilet   Toileting- Clothing Manipulation and Hygiene: Supervision/safety       Functional mobility during ADLs: Min guard       Vision   Vision Assessment?: No apparent visual deficits     Perception     Praxis      Pertinent Vitals/Pain Pain Assessment Pain Assessment: Faces Faces Pain Scale: Hurts little more Pain Location: LUE Pain Descriptors / Indicators: Discomfort, Sore, Guarding  Pain Intervention(s): Limited activity within patient's tolerance, Monitored during session     Hand Dominance Right   Extremity/Trunk Assessment Upper Extremity Assessment Upper Extremity Assessment: LUE deficits/detail LUE Deficits / Details: decreased sensation to dorsum of 5th digit on L  hand, decreased opposition/grasp LUE: Unable to fully assess due to immobilization LUE Sensation: decreased light touch;decreased proprioception LUE Coordination: decreased fine motor;decreased gross motor   Lower Extremity Assessment Lower Extremity Assessment: Defer to PT evaluation   Cervical / Trunk Assessment Cervical / Trunk Assessment: Normal   Communication Communication Communication: No difficulties   Cognition Arousal/Alertness: Awake/alert Behavior During Therapy: WFL for tasks assessed/performed Overall Cognitive Status: Within Functional Limits for tasks assessed                                       General Comments  sling readjusted, reviewed LUE NWB precautions    Exercises     Shoulder Instructions      Home Living Family/patient expects to be discharged to:: Private residence Living Arrangements: Spouse/significant other Available Help at Discharge: Family;Available PRN/intermittently Type of Home: House Home Access: Stairs to enter [employer] of Steps: 4-5 Entrance Stairs-Rails: Right Home Layout: One level     Bathroom Shower/Tub: Tub/shower unit;Walk-in shower (uses walk in shower)   Bathroom Toilet: Standard Bathroom Accessibility: No   Home Equipment: None   Additional Comments: Lives with wife - "someone is there most of the time"      Prior Functioning/Environment Prior Level of Function : Independent/Modified Independent;Working/employed;Driving             Mobility Comments: Independent, working ADLs Comments: Independent without DME        OT Problem List: Decreased range of motion;Decreased strength;Decreased activity tolerance;Impaired balance (sitting and/or standing);Decreased safety awareness;Decreased knowledge of use of DME or AE;Decreased knowledge of precautions;Impaired sensation;Impaired UE functional use;Pain      OT Treatment/Interventions: Self-care/ADL training;Therapeutic  exercise;Neuromuscular education;Splinting;Therapeutic activities;Balance training;Patient/family education;DME and/or AE instruction;Manual therapy;Modalities    OT Goals(Current goals can be found in the care plan section) Acute Rehab OT Goals Patient Stated Goal: surgery OT Goal Formulation: With patient Time For Goal Achievement: 05/13/21 Potential to Achieve Goals: Good  OT Frequency: Min 3X/week    Co-evaluation              AM-PAC OT "6 Clicks" Daily Activity     Outcome Measure Help from another person eating meals?: None Help from another person taking care of personal grooming?: A Little Help from another person toileting, which includes using toliet, bedpan, or urinal?: None Help from another person bathing (including washing, rinsing, drying)?: A Little Help from another person to put on and taking off regular upper body clothing?: A Little Help from another person to put on and taking off regular lower body clothing?: A Little 6 Click Score: 20   End of Session Equipment Utilized During Treatment: Other (comment) (sling) Nurse Communication: Mobility status;Weight bearing status  Activity Tolerance: Patient tolerated treatment well Patient left: in chair;with call bell/phone within reach  OT Visit Diagnosis: Muscle weakness (generalized) (M62.81)                Time: 3785-8850 OT Time Calculation (min): 24 min Charges:  OT General Charges $OT Visit: 1 Visit OT Evaluation $OT Eval Low Complexity: 1 Low OT Treatments $Therapeutic Activity: 8-22 mins  Zahriyah Joo, OTD, OTR/L Acute Rehab [phone]  Mayer Masker 04/29/2021, 12:46 PM

## 2021-04-29 NOTE — Progress Notes (Signed)
°  Mobility Specialist Criteria Algorithm Info.    04/29/21 1150  Mobility  Activity Refused mobility (Declined stating he just walked with OT; will check back as time permits)    04/29/2021 12:11 PM  [place] Sriram Febles, CMS, BS EXP Acute Rehabilitation Services  Phone:[phone] Office: [phone]

## 2021-04-29 NOTE — Progress Notes (Addendum)
Orthopaedic Trauma Progress Note  SUBJECTIVE: Doing okay this morning.  Pain currently controlled.  No chest pain. No SOB. No nausea/vomiting.  States arm feels better and ulnar nerve pain has improved some from preop.  Still having a hard time straightening extending his 2 middle fingers.  No other issues of note. Cultures still pending  OBJECTIVE:  Vitals:   04/29/21 0347 04/29/21 0733  BP: 109/77 118/72  Pulse: 82 84  Resp: 14 17  Temp: 98.6 F (37 C) 98.3 F (36.8 C)  SpO2: 99% 98%    General: Sitting up in bed comfortably, no acute distress Respiratory: No increased work of breathing.  LUE: Long-arm splint in place.  Incisional VAC with good seal and function.  0 mL output in canister currently.  He will wiggle fingers.  Decreased sensation through the ulnar nerve distribution.  Otherwise sensation intact throughout the hand.  Able to flex and extend.  Difficulty with actively extending the second third and fourth digits.  Passively he is able to get them fully extended.  Hand warm and well-perfused.  IMAGING: Stable post op imaging.   LABS:  Results for orders placed or performed during the hospital encounter of 04/28/21 (from the past 24 hour(s))  Aerobic/Anaerobic Culture w Gram Stain (surgical/deep wound)     Status: None (Preliminary result)   Collection Time: 04/28/21 10:00 AM   Specimen: PATH Other; Body Fluid  Result Value Ref Range   Specimen Description ARM    Special Requests LEFT ARM DRAINAGE SPEC A    Gram Stain      NO WBC SEEN NO ORGANISMS SEEN Performed at [facility], [address], [place], [place] [address]    Culture PENDING    Report Status PENDING   Aerobic/Anaerobic Culture w Gram Stain (surgical/deep wound)     Status: None (Preliminary result)   Collection Time: 04/28/21 10:29 AM   Specimen: PATH Soft tissue resection  Result Value Ref Range   Specimen Description ARM    Special Requests LEFT HUMERUS SPEC B    Gram Stain      FEW WBC  PRESENT,BOTH PMN AND MONONUCLEAR NO ORGANISMS SEEN Performed at [facility], [address] [place], [place] [address]    Culture PENDING    Report Status PENDING   Aerobic/Anaerobic Culture w Gram Stain (surgical/deep wound)     Status: None (Preliminary result)   Collection Time: 04/28/21 10:47 AM   Specimen: PATH Soft tissue resection  Result Value Ref Range   Specimen Description ARM    Special Requests LEFT ELBOW FRACTURE SITE SPEC C    Gram Stain      ABUNDANT WBC PRESENT,BOTH PMN AND MONONUCLEAR NO ORGANISMS SEEN Performed at [facility], [address] [place], [place] [address]    Culture PENDING    Report Status PENDING   Glucose, capillary     Status: Abnormal   Collection Time: 04/28/21 11:50 AM  Result Value Ref Range   Glucose-Capillary 136 (H) 70 - 99 mg/dL  Glucose, capillary     Status: Abnormal   Collection Time: 04/28/21  5:55 PM  Result Value Ref Range   Glucose-Capillary 168 (H) 70 - 99 mg/dL  Hemoglobin L9F     Status: Abnormal   Collection Time: 04/28/21  6:12 PM  Result Value Ref Range   Hgb A1c MFr Bld 5.8 (H) 4.8 - 5.6 %   Mean Plasma Glucose 119.76 mg/dL  VITAMIN D 25 Hydroxy (Vit-D Deficiency,  Fractures)     Status: None   Collection Time: 04/28/21  6:12 PM  Result Value Ref Range   Vit D, 25-Hydroxy 34.13 30 - 100 ng/mL  Glucose, capillary     Status: Abnormal   Collection Time: 04/28/21  9:08 PM  Result Value Ref Range   Glucose-Capillary 181 (H) 70 - 99 mg/dL  Basic metabolic panel     Status: Abnormal   Collection Time: 04/29/21  2:26 AM  Result Value Ref Range   Sodium 134 (L) 135 - 145 mmol/L   Potassium 4.0 3.5 - 5.1 mmol/L   Chloride 99 98 - 111 mmol/L   CO2 22 22 - 32 mmol/L   Glucose, Bld 183 (H) 70 - 99 mg/dL   BUN 17 6 - 20 mg/dL   Creatinine, Ser 3.64 0.61 - 1.24 mg/dL   Calcium 8.3 (L) 8.9 - 10.3 mg/dL   GFR, Estimated >68 >03 mL/min   Anion gap 13 5 - 15  CBC     Status: Abnormal   Collection Time:  04/29/21  2:26 AM  Result Value Ref Range   WBC 7.3 4.0 - 10.5 K/uL   RBC 3.71 (L) 4.22 - 5.81 MIL/uL   Hemoglobin 10.7 (L) 13.0 - 17.0 g/dL   HCT 21.2 (L) 24.8 - 25.0 %   MCV 90.3 80.0 - 100.0 fL   MCH 28.8 26.0 - 34.0 pg   MCHC 31.9 30.0 - 36.0 g/dL   RDW 03.7 04.8 - 88.9 %   Platelets 389 150 - 400 K/uL   nRBC 0.0 0.0 - 0.2 %  Glucose, capillary     Status: Abnormal   Collection Time: 04/29/21  7:42 AM  Result Value Ref Range   Glucose-Capillary 151 (H) 70 - 99 mg/dL    ASSESSMENT: Brentt Fread is a 51 y.o. male, 1 Day Post-Op s/p IRRIGATION AND DEBRIDEMENT EXTREMITY HARDWARE  REMOVAL LEFT ELBOW  CV/Blood loss: Acute blood loss anemia, Hgb 10.3 this a.m. Hemodynamically stable  PLAN: Weightbearing: NWB LUE Incisional and dressing care: Dressings left intact until follow-up  Showering: Bed bath for now Orthopedic device(s): Wound Vac:LUE, Splint, and LUE   Pain management:  1. Tylenol 1000 mg q 6 hours scheduled 2. Flexeril 10 mg TID PRN 3. Oxycodone 5-15 mg q 4 hours PRN 4. Neurontin 300 mg BID 5. Dilaudid 0.5-1 mg q 4 hours PRN 6. Celebrex 200 mg BID VTE prophylaxis: Lovenox, SCDs ID: Broad-spectrum antibiotics (ceftriaxone + vancomycin) Foley/Lines:  No foley, KVO IVFs Impediments to Fracture Healing: Infection.  Vitamin D level 34, no supplementation needed. Dispo: Therapies as tolerated today.  Continue with broad spectrum antibiotics for now.  We will continue to follow intraoperative cultures.  Appreciate assistance from ID.  Plan to return to the OR Wednesday versus Thursday for reimplantation of hardware.  Please keep patient NPOafter midnight  Follow - up plan:  TBD  Contact information:  Truitt Merle MD, Thyra Breed PA-C. After hours and holidays please check [email or web address] for group call information for [facility]   Thompson Caul, PA-C [phone] (office) [email or web address]
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
## 2021-04-29 NOTE — Progress Notes (Signed)
[facility]  Date of Admission:  04/28/2021      Total days of antibiotics 2  Vancomycin 1/30 >> 1/31  Ceftriaxone 1/30 >>          ASSESSMENT: Mark Powers is a 51 y.o. 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 All 3 surgical specimens are growing out gram negative rods on culture, yet to be identified. Will stop vancomycin and continue ceftriaxone for now.  Follow up OR findings and treatment tomorrow (possibly Wed or Thursday for OR take back - not scheduled yet). Hopeful for an ID and susceptibilities in next 48h for treatment plan.  Tolerating antibiotics without any concern for side effect or intolerance.  Creatinine stable, no dose adjustments needed; will be off vancomycin going forward.    PLAN: Stop vancomycin  Continue ceftriaxone IV Follow pending micro  Follow pending OR findings/interventions   Principal Problem:   Left supracondylar humerus fracture, with delayed healing, subsequent encounter Active Problems:   Left radial head fracture   Internal orthopedic device with infection or inflammatory reaction (HCC)    acetaminophen  1,000 mg Oral Q6H   amLODipine  5 mg Oral Daily   celecoxib  200 mg Oral BID   docusate sodium  100 mg Oral BID   empagliflozin  25 mg Oral Daily   enoxaparin (LOVENOX) injection  40 mg Subcutaneous Q24H   gabapentin  300 mg Oral BID   hydrochlorothiazide  12.5 mg Oral Daily   icosapent Ethyl  2 g Oral BID   insulin aspart  0-15 Units Subcutaneous TID WC   insulin aspart  0-5 Units Subcutaneous QHS   irbesartan  150 mg Oral Daily   metFORMIN  1,000 mg Oral BID WC    SUBJECTIVE: Up in the chair today. Feeling well and better in general vs prior to surgery.  Planning return to OR tomorrow for new hardware placement for unstable radial head fracture.    Review of Systems: Review of Systems  Constitutional:   Negative for chills, fever and malaise/fatigue.  Gastrointestinal:  Negative for abdominal pain, diarrhea, nausea and vomiting.  Musculoskeletal:  Positive for joint pain (improved L elbow pain).  Skin:  Negative for itching and rash.    No Known Allergies  OBJECTIVE: Vitals:   04/29/21 0000 04/29/21 0347 04/29/21 0733 04/29/21 0900  BP: 130/78 109/77 118/72 107/65  Pulse: 86 82 84 91  Resp: 14 14 17 20   Temp: 98.2 F (36.8 C) 98.6 F (37 C) 98.3 F (36.8 C)   TempSrc: Oral Oral Oral   SpO2: 98% 99% 98% 100%  Weight:      Height:       Body mass index is 33.64 kg/m.   Physical Exam Vitals reviewed.  Constitutional:      Appearance: He is well-developed.     Comments: Sitting comfortably in recliner. No distress.   HENT:     Mouth/Throat:     Dentition: Normal dentition. No dental abscesses.  Cardiovascular:     Rate and Rhythm: Normal rate and regular rhythm.     Heart sounds: Normal heart sounds.  Pulmonary:     Effort: Pulmonary effort is normal.  Abdominal:     General: There is no distension.  Musculoskeletal:     Comments: LUE in sling with vac in place. All dressing material are clean and dry.   Lymphadenopathy:  Cervical: No cervical adenopathy.  Skin:    General: Skin is warm and dry.     Findings: No rash.  Neurological:     Mental Status: He is alert and oriented to person, place, and time.  Psychiatric:        Judgment: Judgment normal.    Lab Results Lab Results  Component Value Date   WBC 7.3 04/29/2021   HGB 10.7 (L) 04/29/2021   HCT 33.5 (L) 04/29/2021   MCV 90.3 04/29/2021   PLT 389 04/29/2021    Lab Results  Component Value Date   CREATININE 0.91 04/29/2021   BUN 17 04/29/2021   NA 134 (L) 04/29/2021   K 4.0 04/29/2021   CL 99 04/29/2021   CO2 22 04/29/2021   No results found for: ALT, AST, GGT, ALKPHOS, BILITOT   Microbiology: Recent Results (from the past 240 hour(s))  SARS Coronavirus 2 by RT PCR (hospital order,  performed in [facility] lab) Nasopharyngeal Nasopharyngeal Swab     Status: None   Collection Time: 04/28/21  6:40 AM   Specimen: Nasopharyngeal Swab  Result Value Ref Range Status   SARS Coronavirus 2 NEGATIVE NEGATIVE Final    Comment: (NOTE) SARS-CoV-2 target nucleic acids are NOT DETECTED.  The SARS-CoV-2 RNA is generally detectable in upper and lower respiratory specimens during the acute phase of infection. The lowest concentration of SARS-CoV-2 viral copies this assay can detect is 250 copies / mL. A negative result does not preclude SARS-CoV-2 infection and should not be used as the sole basis for treatment or other patient management decisions.  A negative result may occur with improper specimen collection / handling, submission of specimen other than nasopharyngeal swab, presence of viral mutation(s) within the areas targeted by this assay, and inadequate number of viral copies (<250 copies / mL). A negative result must be combined with clinical observations, patient history, and epidemiological information.  Fact Sheet for Patients:   BoilerBrush.com.cyhttps://www.fda.gov/media/136312/download  Fact Sheet for Healthcare Providers: https://pope.com/https://www.fda.gov/media/136313/download  This test is not yet approved or  cleared by the [place] FDA and has been authorized for detection and/or diagnosis of SARS-CoV-2 by FDA under an Emergency Use Authorization (EUA).  This EUA will remain in effect (meaning this test can be used) for the duration of the COVID-19 declaration under Section 564(b)(1) of the Act, 21 U.S.C. section 360bbb-3(b)(1), unless the authorization is terminated or revoked sooner.  Performed at [facility], [address] [place], [place] [address]   Aerobic/Anaerobic Culture w Gram Stain (surgical/deep wound)     Status: None (Preliminary result)   Collection Time: 04/28/21 10:00 AM   Specimen: PATH Other; Body Fluid  Result Value Ref Range Status    Specimen Description ARM  Final   Special Requests LEFT ARM DRAINAGE SPEC A  Final   Gram Stain   Final    NO WBC SEEN NO ORGANISMS SEEN Performed at [facility], [address] [place], [place] [address]    Culture RARE GRAM NEGATIVE RODS  Final   Report Status PENDING  Incomplete  Aerobic/Anaerobic Culture w Gram Stain (surgical/deep wound)     Status: None (Preliminary result)   Collection Time: 04/28/21 10:29 AM   Specimen: PATH Soft tissue resection  Result Value Ref Range Status   Specimen Description ARM  Final   Special Requests LEFT HUMERUS SPEC B  Final   Gram Stain   Final    FEW WBC PRESENT,BOTH PMN AND MONONUCLEAR NO  ORGANISMS SEEN Performed at [facility], [address] [place], [place] [address]    Culture RARE GRAM NEGATIVE RODS  Final   Report Status PENDING  Incomplete  Aerobic/Anaerobic Culture w Gram Stain (surgical/deep wound)     Status: None (Preliminary result)   Collection Time: 04/28/21 10:47 AM   Specimen: PATH Soft tissue resection  Result Value Ref Range Status   Specimen Description ARM  Final   Special Requests LEFT ELBOW FRACTURE SITE SPEC C  Final   Gram Stain   Final    ABUNDANT WBC PRESENT,BOTH PMN AND MONONUCLEAR NO ORGANISMS SEEN Performed at [facility], [address] [place], [place] [address]    Culture FEW Romie Minus NEGATIVE RODS  Final   Report Status PENDING  Incomplete    Rexene Alberts, MSN, NP-C [facility] Pager: [phone] *Secure Chat Preferred   04/29/2021  10:50 AM

## 2021-04-30 ENCOUNTER — Inpatient Hospital Stay (HOSPITAL_COMMUNITY): Payer: PRIVATE HEALTH INSURANCE

## 2021-04-30 ENCOUNTER — Encounter (HOSPITAL_COMMUNITY): Payer: Self-pay | Admitting: Student

## 2021-04-30 ENCOUNTER — Inpatient Hospital Stay: Payer: Self-pay

## 2021-04-30 ENCOUNTER — Encounter (HOSPITAL_COMMUNITY)
Admission: RE | Disposition: A | Discharge status: Home Health | Payer: Self-pay | Source: Home / Self Care | Attending: Student

## 2021-04-30 ENCOUNTER — Inpatient Hospital Stay (HOSPITAL_COMMUNITY): Payer: PRIVATE HEALTH INSURANCE | Admitting: Anesthesiology

## 2021-04-30 ENCOUNTER — Other Ambulatory Visit: Payer: Self-pay

## 2021-04-30 DIAGNOSIS — S42412G Displaced simple supracondylar fracture without intercondylar fracture of left humerus, subsequent encounter for fracture with delayed healing: Secondary | ICD-10-CM | POA: Diagnosis not present

## 2021-04-30 HISTORY — PX: ORIF RADIAL FRACTURE: SHX5113

## 2021-04-30 HISTORY — PX: ORIF HUMERUS FRACTURE: SHX2126

## 2021-04-30 LAB — BASIC METABOLIC PANEL
Anion gap: 8 (ref 5–15)
BUN: 14 mg/dL (ref 6–20)
CO2: 26 mmol/L (ref 22–32)
Calcium: 8.4 mg/dL — ABNORMAL LOW (ref 8.9–10.3)
Chloride: 101 mmol/L (ref 98–111)
Creatinine, Ser: 0.79 mg/dL (ref 0.61–1.24)
GFR, Estimated: >60 mL/min (ref >60)
Glucose, Bld: 123 mg/dL — ABNORMAL HIGH (ref 70–99)
Potassium: 3.8 mmol/L (ref 3.5–5.1)
Sodium: 135 mmol/L (ref 135–145)

## 2021-04-30 LAB — CBC
HCT: 31.4 % — ABNORMAL LOW (ref 39.0–52.0)
Hemoglobin: 9.6 g/dL — ABNORMAL LOW (ref 13.0–17.0)
MCH: 27.7 pg (ref 26.0–34.0)
MCHC: 30.6 g/dL (ref 30.0–36.0)
MCV: 90.5 fL (ref 80.0–100.0)
Platelets: 325 10*3/uL (ref 150–400)
RBC: 3.47 MIL/uL — ABNORMAL LOW (ref 4.22–5.81)
RDW: 15.1 % (ref 11.5–15.5)
WBC: 7 10*3/uL (ref 4.0–10.5)
nRBC: 0 % (ref 0.0–0.2)

## 2021-04-30 LAB — GLUCOSE, CAPILLARY
Glucose-Capillary: 146 mg/dL — ABNORMAL HIGH (ref 70–99)
Glucose-Capillary: 114 mg/dL — ABNORMAL HIGH (ref 70–99)
Glucose-Capillary: 143 mg/dL — ABNORMAL HIGH (ref 70–99)
Glucose-Capillary: 143 mg/dL — ABNORMAL HIGH (ref 70–99)
Glucose-Capillary: 169 mg/dL — ABNORMAL HIGH (ref 70–99)

## 2021-04-30 SURGERY — OPEN REDUCTION INTERNAL FIXATION (ORIF) DISTAL HUMERUS FRACTURE
Anesthesia: General | Laterality: Left

## 2021-04-30 MED ORDER — LACTATED RINGERS IV SOLN: INTRAVENOUS | Status: DC

## 2021-04-30 MED ORDER — KETAMINE HCL 50 MG/5ML IJ SOSY
PREFILLED_SYRINGE | INTRAMUSCULAR | Status: AC
  Filled 2021-04-30: qty 5

## 2021-04-30 MED ORDER — OXYCODONE HCL 5 MG/5ML PO SOLN: 5.0000 mg | Freq: Once | ORAL | Status: DC | PRN

## 2021-04-30 MED ORDER — CHLORHEXIDINE GLUCONATE CLOTH 2 % EX PADS
6.0000 | MEDICATED_PAD | Freq: Every day | CUTANEOUS | Status: DC
  Administered 2021-05-01: 6 via TOPICAL

## 2021-04-30 MED ORDER — ORAL CARE MOUTH RINSE: 15.0000 mL | Freq: Once | OROMUCOSAL | Status: AC

## 2021-04-30 MED ORDER — LIDOCAINE 2% (20 MG/ML) 5 ML SYRINGE
INTRAMUSCULAR | Status: DC | PRN
  Administered 2021-04-30: 60 mg via INTRAVENOUS

## 2021-04-30 MED ORDER — 0.9 % SODIUM CHLORIDE (POUR BTL) OPTIME
TOPICAL | Status: DC | PRN
  Administered 2021-04-30: 1000 mL

## 2021-04-30 MED ORDER — KETAMINE HCL 10 MG/ML IJ SOLN
INTRAMUSCULAR | Status: DC | PRN
  Administered 2021-04-30 (×2): 10 mg via INTRAVENOUS
  Administered 2021-04-30: 30 mg via INTRAVENOUS

## 2021-04-30 MED ORDER — SODIUM CHLORIDE 0.9% FLUSH: 10.0000 mL | INTRAVENOUS | Status: DC | PRN

## 2021-04-30 MED ORDER — FENTANYL CITRATE (PF) 100 MCG/2ML IJ SOLN
INTRAMUSCULAR | Status: AC
  Filled 2021-04-30: qty 2

## 2021-04-30 MED ORDER — VANCOMYCIN HCL 1000 MG IV SOLR
INTRAVENOUS | Status: AC
  Filled 2021-04-30: qty 20

## 2021-04-30 MED ORDER — OXYCODONE HCL 5 MG PO TABS: 5.0000 mg | ORAL_TABLET | Freq: Once | ORAL | Status: DC | PRN

## 2021-04-30 MED ORDER — AMISULPRIDE (ANTIEMETIC) 5 MG/2ML IV SOLN: 10.0000 mg | Freq: Once | INTRAVENOUS | Status: DC | PRN

## 2021-04-30 MED ORDER — PROPOFOL 10 MG/ML IV BOLUS
INTRAVENOUS | Status: AC
  Filled 2021-04-30: qty 20

## 2021-04-30 MED ORDER — ACETAMINOPHEN 500 MG PO TABS
1000.0000 mg | ORAL_TABLET | Freq: Once | ORAL | Status: AC
  Administered 2021-04-30: 1000 mg via ORAL
  Filled 2021-04-30: qty 2

## 2021-04-30 MED ORDER — ALUM & MAG HYDROXIDE-SIMETH 200-200-20 MG/5ML PO SUSP
30.0000 mL | Freq: Two times a day (BID) | ORAL | Status: DC
  Administered 2021-04-30 – 2021-05-01 (×2): 30 mL via ORAL
  Filled 2021-04-30 (×2): qty 30

## 2021-04-30 MED ORDER — LIDOCAINE 2% (20 MG/ML) 5 ML SYRINGE
INTRAMUSCULAR | Status: AC
  Filled 2021-04-30: qty 15

## 2021-04-30 MED ORDER — MIDAZOLAM HCL 2 MG/2ML IJ SOLN
INTRAMUSCULAR | Status: DC | PRN
  Administered 2021-04-30: 2 mg via INTRAVENOUS

## 2021-04-30 MED ORDER — DEXAMETHASONE SODIUM PHOSPHATE 10 MG/ML IJ SOLN
INTRAMUSCULAR | Status: AC
  Filled 2021-04-30: qty 3

## 2021-04-30 MED ORDER — DEXMEDETOMIDINE (PRECEDEX) IN NS 20 MCG/5ML (4 MCG/ML) IV SYRINGE
PREFILLED_SYRINGE | INTRAVENOUS | Status: DC | PRN
  Administered 2021-04-30 (×2): 8 ug via INTRAVENOUS
  Administered 2021-04-30: 4 ug via INTRAVENOUS

## 2021-04-30 MED ORDER — HYDROMORPHONE HCL 1 MG/ML IJ SOLN
INTRAMUSCULAR | Status: AC
  Filled 2021-04-30: qty 0.5

## 2021-04-30 MED ORDER — PHENYLEPHRINE 40 MCG/ML (10ML) SYRINGE FOR IV PUSH (FOR BLOOD PRESSURE SUPPORT)
PREFILLED_SYRINGE | INTRAVENOUS | Status: AC
  Filled 2021-04-30: qty 10

## 2021-04-30 MED ORDER — HYDROMORPHONE HCL 1 MG/ML IJ SOLN
INTRAMUSCULAR | Status: DC | PRN
  Administered 2021-04-30 (×2): .5 mg via INTRAVENOUS

## 2021-04-30 MED ORDER — FENTANYL CITRATE (PF) 100 MCG/2ML IJ SOLN
50.0000 ug | INTRAMUSCULAR | Status: DC | PRN
  Administered 2021-04-30 (×2): 50 ug via INTRAVENOUS

## 2021-04-30 MED ORDER — CHLORHEXIDINE GLUCONATE 0.12 % MT SOLN: 15.0000 mL | Freq: Once | OROMUCOSAL | Status: AC

## 2021-04-30 MED ORDER — TOBRAMYCIN SULFATE 1.2 G IJ SOLR
INTRAMUSCULAR | Status: AC
  Filled 2021-04-30: qty 1.2

## 2021-04-30 MED ORDER — ONDANSETRON HCL 4 MG/2ML IJ SOLN
INTRAMUSCULAR | Status: DC | PRN
Start: 2021-04-30 — End: 2021-04-30
  Administered 2021-04-30: 4 mg via INTRAVENOUS

## 2021-04-30 MED ORDER — DEXAMETHASONE SODIUM PHOSPHATE 10 MG/ML IJ SOLN
INTRAMUSCULAR | Status: DC | PRN
  Administered 2021-04-30: 10 mg via INTRAVENOUS

## 2021-04-30 MED ORDER — SODIUM CHLORIDE 0.9 % IR SOLN
Status: DC | PRN
Start: 2021-04-30 — End: 2021-04-30
  Administered 2021-04-30: 3000 mL

## 2021-04-30 MED ORDER — ROCURONIUM BROMIDE 10 MG/ML (PF) SYRINGE
PREFILLED_SYRINGE | INTRAVENOUS | Status: AC
  Filled 2021-04-30: qty 30

## 2021-04-30 MED ORDER — FENTANYL CITRATE (PF) 250 MCG/5ML IJ SOLN
INTRAMUSCULAR | Status: AC
  Filled 2021-04-30: qty 5

## 2021-04-30 MED ORDER — PROMETHAZINE HCL 25 MG/ML IJ SOLN: 6.2500 mg | INTRAMUSCULAR | Status: DC | PRN

## 2021-04-30 MED ORDER — MIDAZOLAM HCL 2 MG/2ML IJ SOLN
INTRAMUSCULAR | Status: AC
  Filled 2021-04-30: qty 2

## 2021-04-30 MED ORDER — CHLORHEXIDINE GLUCONATE 0.12 % MT SOLN
OROMUCOSAL | Status: AC
  Administered 2021-04-30: 15 mL via OROMUCOSAL
  Filled 2021-04-30: qty 15

## 2021-04-30 MED ORDER — FENTANYL CITRATE (PF) 250 MCG/5ML IJ SOLN
INTRAMUSCULAR | Status: DC | PRN
  Administered 2021-04-30: 100 ug via INTRAVENOUS
  Administered 2021-04-30 (×5): 50 ug via INTRAVENOUS

## 2021-04-30 MED ORDER — ROCURONIUM BROMIDE 10 MG/ML (PF) SYRINGE
PREFILLED_SYRINGE | INTRAVENOUS | Status: DC | PRN
  Administered 2021-04-30: 60 mg via INTRAVENOUS
  Administered 2021-04-30: 20 mg via INTRAVENOUS
  Administered 2021-04-30: 40 mg via INTRAVENOUS

## 2021-04-30 MED ORDER — TOBRAMYCIN SULFATE 1.2 G IJ SOLR
INTRAMUSCULAR | Status: DC | PRN
  Administered 2021-04-30: 1.2 g

## 2021-04-30 MED ORDER — PROPOFOL 10 MG/ML IV BOLUS
INTRAVENOUS | Status: DC | PRN
  Administered 2021-04-30: 250 mg via INTRAVENOUS

## 2021-04-30 MED ORDER — VANCOMYCIN HCL 1000 MG IV SOLR
INTRAVENOUS | Status: DC | PRN
  Administered 2021-04-30: 1000 mg

## 2021-04-30 MED ORDER — ONDANSETRON HCL 4 MG/2ML IJ SOLN
INTRAMUSCULAR | Status: AC
  Filled 2021-04-30: qty 6

## 2021-04-30 SURGICAL SUPPLY — 74 items
BAG COUNTER SPONGE SURGICOUNT (BAG) ×2 IMPLANT
BIT DRILL QC 2.0 SHORT EVOS SM (DRILL) IMPLANT
BIT DRILL QC 2.5MM SHRT EVO SM (DRILL) IMPLANT
BLADE AVERAGE 25X9 (BLADE) ×1 IMPLANT
BNDG COHESIVE 4X5 TAN STRL (GAUZE/BANDAGES/DRESSINGS) ×2 IMPLANT
BNDG ELASTIC 3X5.8 VLCR STR LF (GAUZE/BANDAGES/DRESSINGS) ×1 IMPLANT
BNDG ELASTIC 4X5.8 VLCR STR LF (GAUZE/BANDAGES/DRESSINGS) ×1 IMPLANT
BRUSH SCRUB EZ PLAIN DRY (MISCELLANEOUS) ×4 IMPLANT
CLEANER TIP ELECTROSURG 2X2 (MISCELLANEOUS) ×2 IMPLANT
COVER SURGICAL LIGHT HANDLE (MISCELLANEOUS) ×2 IMPLANT
DRAPE C-ARM 42X72 X-RAY (DRAPES) ×2 IMPLANT
DRAPE C-ARMOR (DRAPES) IMPLANT
DRAPE HALF SHEET 40X57 (DRAPES) ×2 IMPLANT
DRAPE INCISE IOBAN 66X45 STRL (DRAPES) IMPLANT
DRAPE ORTHO SPLIT 77X108 STRL (DRAPES) ×1
DRAPE SURG ORHT 6 SPLT 77X108 (DRAPES) ×1 IMPLANT
DRAPE U-SHAPE 47X51 STRL (DRAPES) ×2 IMPLANT
DRILL QC 2.0 SHORT EVOS SM (DRILL) ×2
DRILL QC 2.5MM SHORT EVOS SM (DRILL) ×2
DRSG MEPITEL 4X7.2 (GAUZE/BANDAGES/DRESSINGS) ×1 IMPLANT
ELECT REM PT RETURN 9FT ADLT (ELECTROSURGICAL) ×2
ELECTRODE REM PT RTRN 9FT ADLT (ELECTROSURGICAL) ×1 IMPLANT
GAUZE SPONGE 4X4 12PLY STRL (GAUZE/BANDAGES/DRESSINGS) ×2 IMPLANT
GLOVE SURG ENC MOIS LTX SZ6.5 (GLOVE) ×6 IMPLANT
GLOVE SURG ENC MOIS LTX SZ7.5 (GLOVE) ×6 IMPLANT
GLOVE SURG UNDER POLY LF SZ6.5 (GLOVE) ×2 IMPLANT
GLOVE SURG UNDER POLY LF SZ7.5 (GLOVE) ×2 IMPLANT
GOWN STRL REUS W/ TWL LRG LVL3 (GOWN DISPOSABLE) ×3 IMPLANT
GOWN STRL REUS W/ TWL XL LVL3 (GOWN DISPOSABLE) ×1 IMPLANT
GOWN STRL REUS W/TWL LRG LVL3 (GOWN DISPOSABLE) ×3
GOWN STRL REUS W/TWL XL LVL3 (GOWN DISPOSABLE) ×1
K-WIRE 1.6 (WIRE) ×2
K-WIRE FX150X1.6XTROC PNT (WIRE) ×2
KIT BASIN OR (CUSTOM PROCEDURE TRAY) ×2 IMPLANT
KIT TURNOVER KIT B (KITS) ×2 IMPLANT
KWIRE FX150X1.6XTROC PNT (WIRE) IMPLANT
LOOP VESSEL MAXI BLUE (MISCELLANEOUS) ×2 IMPLANT
MANIFOLD NEPTUNE II (INSTRUMENTS) ×2 IMPLANT
NS IRRIG 1000ML POUR BTL (IV SOLUTION) ×2 IMPLANT
PACK ORTHO EXTREMITY (CUSTOM PROCEDURE TRAY) ×2 IMPLANT
PAD ARMBOARD 7.5X6 YLW CONV (MISCELLANEOUS) ×4 IMPLANT
PAD CAST 4YDX4 CTTN HI CHSV (CAST SUPPLIES) IMPLANT
PADDING CAST COTTON 4X4 STRL (CAST SUPPLIES) ×1
PADDING CAST COTTON 6X4 STRL (CAST SUPPLIES) ×1 IMPLANT
PLATE DIS HUM EVOS 2.7X151 (Plate) ×1 IMPLANT
PLATE HUM EVOS 5H L 2.7X102 (Plate) ×1 IMPLANT
SCREW BONE LOCKING 2.7 X 28 (Screw) ×1 IMPLANT
SCREW CORT 2.7X16 STAR T8 EVOS (Screw) ×1 IMPLANT
SCREW CORT 3.5X22 ST EVOS (Screw) ×1 IMPLANT
SCREW CORT 3.5X30 ST EVOS (Screw) ×1 IMPLANT
SCREW CORT EVOS ST 3.5X28 (Screw) ×2 IMPLANT
SCREW CORTEX 3.5X24MM (Screw) ×1 IMPLANT
SCREW CORTEX 3.5X26 (Screw) ×5 IMPLANT
SCREW LOCK 2.7X26 (Screw) ×2 IMPLANT
SCREW LOCK EVOS 2.7X32 (Screw) ×1 IMPLANT
SCREW LOCK ST EVOS 2.7X20 (Screw) ×1 IMPLANT
SPONGE T-LAP 18X18 ~~LOC~~+RFID (SPONGE) IMPLANT
SUCTION FRAZIER HANDLE 10FR (MISCELLANEOUS) ×1
SUCTION TUBE FRAZIER 10FR DISP (MISCELLANEOUS) ×1 IMPLANT
SUT ETHILON 2 0 PSLX (SUTURE) ×1 IMPLANT
SUT ETHILON 3 0 PS 1 (SUTURE) ×5 IMPLANT
SUT MON AB 2-0 CT1 27 (SUTURE) ×1 IMPLANT
SUT MON AB 2-0 CT1 36 (SUTURE) ×1 IMPLANT
SUT PDS AB 0 CT1 27 (SUTURE) ×2 IMPLANT
SUT VIC AB 0 CT1 27 (SUTURE) ×2
SUT VIC AB 0 CT1 27XBRD ANBCTR (SUTURE) ×2 IMPLANT
SUT VIC AB 2-0 CT1 27 (SUTURE) ×2
SUT VIC AB 2-0 CT1 TAPERPNT 27 (SUTURE) ×2 IMPLANT
SYR 5ML LL (SYRINGE) IMPLANT
SYR CONTROL 10ML LL (SYRINGE) ×2 IMPLANT
TOWEL GREEN STERILE (TOWEL DISPOSABLE) ×2 IMPLANT
TOWEL GREEN STERILE FF (TOWEL DISPOSABLE) ×2 IMPLANT
TRAY FOLEY MTR SLVR 16FR STAT (SET/KITS/TRAYS/PACK) IMPLANT
YANKAUER SUCT BULB TIP NO VENT (SUCTIONS) IMPLANT

## 2021-04-30 NOTE — H&P (View-Only) (Signed)
Ortho Trauma Note ° °Patient doing well.  Pain is well controlled.  Cultures growing Enterobacter.  Plan to proceed today for open reduction internal fixation of distal humerus with open reduction internal fixation of radial head versus possible radial head resection.  I discussed with him the need to get the radial head fixed and stabilized however if this is not possible we will perform resection to allow for pronation supination as well as flexion extension.  Risk and benefits were discussed with patient.  He agrees to proceed with surgery and consent was obtained. ° °Conner Neiss P. Mouna Yager, MD °[facility] °[phone] (office) °[email or web address] ° °

## 2021-04-30 NOTE — Transfer of Care (Signed)
Immediate Anesthesia Transfer of Care Note  Patient: Mark Powers  Procedure(s) Performed: OPEN REDUCTION INTERNAL FIXATION (ORIF) DISTAL HUMERUS FRACTURE (Left) OPEN REDUCTION INTERNAL FIXATION (ORIF) RADIAL HEAD FRACTURE (Left)  Patient Location: PACU  Anesthesia Type:General  Level of Consciousness: drowsy and patient cooperative  Airway & Oxygen Therapy: Patient Spontanous Breathing  Post-op Assessment: Report given to RN and Post -op Vital signs reviewed and stable  Post vital signs: Reviewed and stable  Last Vitals:  Vitals Value Taken Time  BP 132/90 04/30/21 1743  Temp    Pulse 97 04/30/21 1745  Resp 15 04/30/21 1745  SpO2 97 % 04/30/21 1745  Vitals shown include unvalidated device data.  Last Pain:  Vitals:   04/30/21 1113  TempSrc:   PainSc: 5       Patients Stated Pain Goal: 2 (04/30/21 1000)  Complications: No notable events documented.

## 2021-04-30 NOTE — Progress Notes (Signed)
[name]  Date of Admission:  04/28/2021      Total days of antibiotics 3  Cefepime 1/31 >> current Vancomycin 1/30 >> 1/31  Ceftriaxone 1/30 >>          ASSESSMENT: Mark Powers is a 51 y.o. 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 All intraoperative cultures with growth of Enterobacter species - susceptibilities back. Will continue Cefepime TID for a few weeks. With control of original source of infection I think we can safely spare him long course of IV with a well absorbed oral equivalent (quinolone vs higher dose bactrim) to continue treating his bone infection after a few weeks. Will follow along with further orthopedic plans after bone has a chance to heal.    PLAN: Continue cefepime Will order picc line for placement (rt arm)  Will notify / coordinate with home health to plan discharge and patient teaching OPAT as outlined below    OPAT ORDERS:  Diagnosis: hardware associated osteomyelitis Left elbow, nonunion of bone  Culture Result: Enterobacter hormaechei   No Known Allergies   Discharge antibiotics to be given via PICC line:  Per pharmacy protocol Cefepime 2 gm TID    Duration: ~3 weeks   End Date: 05/20/2021  [facility] Care Per Protocol with [employer] Use: Home health RN for IV administration and teaching, line care and labs.    Labs weekly while on IV antibiotics: _x_ CBC with differential _x_ CMP _x_ CRP _x_ ESR  _x_ Please pull PIC at completion of IV antibiotics __ Please leave PIC in place until doctor has seen patient or been notified  Fax weekly labs to [phone]  Clinic Follow Up Appt: 2/21 @ 10:15 am with Dr. Linus Salmons    Principal Problem:   Left supracondylar humerus fracture, with delayed healing, subsequent encounter Active Problems:   Left radial head fracture   Internal orthopedic device with infection or  inflammatory reaction (HCC)    [MAR Hold] amLODipine  5 mg Oral Daily   [MAR Hold] celecoxib  200 mg Oral BID   [MAR Hold] docusate sodium  100 mg Oral BID   [MAR Hold] empagliflozin  25 mg Oral Daily   [MAR Hold] enoxaparin (LOVENOX) injection  40 mg Subcutaneous Q24H   [MAR Hold] gabapentin  300 mg Oral BID   [MAR Hold] hydrochlorothiazide  12.5 mg Oral Daily   [MAR Hold] icosapent Ethyl  2 g Oral BID   [MAR Hold] insulin aspart  0-15 Units Subcutaneous TID WC   [MAR Hold] insulin aspart  0-5 Units Subcutaneous QHS   [MAR Hold] irbesartan  150 mg Oral Daily   [MAR Hold] metFORMIN  1,000 mg Oral BID WC    SUBJECTIVE: Hurting today in left elbow. Planning return to OR for replacement of hardware to stabilize fracture.  No concerns otherwise.  OK with getting a picc line and has home support for treatment with his left arm being un-useable    Review of Systems: Review of Systems  Constitutional:  Negative for chills, fever and malaise/fatigue.  Gastrointestinal:  Negative for abdominal pain, diarrhea, nausea and vomiting.  Musculoskeletal:  Positive for joint pain (improved L elbow pain).  Skin:  Negative for itching and rash.    No Known Allergies  OBJECTIVE: Vitals:   04/29/21 1924 04/30/21 0405 04/30/21 0809 04/30/21 1153  BP: 120/71 119/76 118/78 121/74  Pulse: 87 90  91 91  Resp: 19 18 16 18   Temp: 97.9 F (36.6 C) 98 F (36.7 C) 98.9 F (37.2 C) 97.9 F (36.6 C)  TempSrc:  Oral Oral   SpO2: 98% 97% 99% 100%  Weight:    115.7 kg  Height:    6' 1"  (1.854 m)   Body mass index is 33.64 kg/m.   Physical Exam Vitals reviewed.  Constitutional:      Appearance: He is well-developed.     Comments: Sitting comfortably in recliner. No distress.   HENT:     Mouth/Throat:     Dentition: Normal dentition. No dental abscesses.  Cardiovascular:     Rate and Rhythm: Normal rate and regular rhythm.     Heart sounds: Normal heart sounds.  Pulmonary:     Effort:  Pulmonary effort is normal.  Abdominal:     General: There is no distension.  Musculoskeletal:     Comments: LUE in sling with vac in place. All dressing material are clean and dry.   Lymphadenopathy:     Cervical: No cervical adenopathy.  Skin:    General: Skin is warm and dry.     Findings: No rash.  Neurological:     Mental Status: He is alert and oriented to person, place, and time.  Psychiatric:        Judgment: Judgment normal.    Lab Results Lab Results  Component Value Date   WBC 7.0 04/30/2021   HGB 9.6 (L) 04/30/2021   HCT 31.4 (L) 04/30/2021   MCV 90.5 04/30/2021   PLT 325 04/30/2021    Lab Results  Component Value Date   CREATININE 0.79 04/30/2021   BUN 14 04/30/2021   NA 135 04/30/2021   K 3.8 04/30/2021   CL 101 04/30/2021   CO2 26 04/30/2021   No results found for: ALT, AST, GGT, ALKPHOS, BILITOT   Microbiology: Recent Results (from the past 240 hour(s))  SARS Coronavirus 2 by RT PCR (hospital order, performed in [place] hospital lab) Nasopharyngeal Nasopharyngeal Swab     Status: None   Collection Time: 04/28/21  6:40 AM   Specimen: Nasopharyngeal Swab  Result Value Ref Range Status   SARS Coronavirus 2 NEGATIVE NEGATIVE Final    Comment: (NOTE) SARS-CoV-2 target nucleic acids are NOT DETECTED.  The SARS-CoV-2 RNA is generally detectable in upper and lower respiratory specimens during the acute phase of infection. The lowest concentration of SARS-CoV-2 viral copies this assay can detect is 250 copies / mL. A negative result does not preclude SARS-CoV-2 infection and should not be used as the sole basis for treatment or other patient management decisions.  A negative result may occur with improper specimen collection / handling, submission of specimen other than nasopharyngeal swab, presence of viral mutation(s) within the areas targeted by this assay, and inadequate number of viral copies (<250 copies / mL). A negative result must be combined  with clinical observations, patient history, and epidemiological information.  Fact Sheet for Patients:   StrictlyIdeas.no  Fact Sheet for Healthcare Providers: BankingDealers.co.za  This test is not yet approved or  cleared by the [place] FDA and has been authorized for detection and/or diagnosis of SARS-CoV-2 by FDA under an Emergency Use Authorization (EUA).  This EUA will remain in effect (meaning this test can be used) for the duration of the COVID-19 declaration under Section 564(b)(1) of the Act, 21 U.S.C. section 360bbb-3(b)(1), unless the authorization is terminated or revoked sooner.  Performed at [facility], [place] [address] [place], [place] [address]   Aerobic/Anaerobic Culture w Gram Stain (surgical/deep wound)     Status: None (Preliminary result)   Collection Time: 04/28/21 10:00 AM   Specimen: PATH Other; Body Fluid  Result Value Ref Range Status   Specimen Description ARM  Final   Special Requests LEFT ARM DRAINAGE SPEC A  Final   Gram Stain   Final    NO WBC SEEN NO ORGANISMS SEEN Performed at [facility], [address] [place], [place] [address]    Culture RARE GRAM NEGATIVE RODS  Final   Report Status PENDING  Incomplete  Aerobic/Anaerobic Culture w Gram Stain (surgical/deep wound)     Status: None (Preliminary result)   Collection Time: 04/28/21 10:29 AM   Specimen: PATH Soft tissue resection  Result Value Ref Range Status   Specimen Description ARM  Final   Special Requests LEFT HUMERUS SPEC B  Final   Gram Stain   Final    FEW WBC PRESENT,BOTH PMN AND MONONUCLEAR NO ORGANISMS SEEN Performed at [facility], [address], [place], [place] [address]    Culture   Final    RARE ENTEROBACTER HORMAECHEI NO ANAEROBES ISOLATED; CULTURE IN PROGRESS FOR 5 DAYS    Report Status PENDING  Incomplete   Organism ID, Bacteria ENTEROBACTER HORMAECHEI  Final      Susceptibility    Enterobacter hormaechei - MIC*    CEFAZOLIN >=64 RESISTANT Resistant     CEFEPIME <=0.12 SENSITIVE Sensitive     CEFTAZIDIME <=1 SENSITIVE Sensitive     CIPROFLOXACIN <=0.25 SENSITIVE Sensitive     GENTAMICIN <=1 SENSITIVE Sensitive     IMIPENEM <=0.25 SENSITIVE Sensitive     TRIMETH/SULFA <=20 SENSITIVE Sensitive     PIP/TAZO <=4 SENSITIVE Sensitive     * RARE ENTEROBACTER HORMAECHEI  Aerobic/Anaerobic Culture w Gram Stain (surgical/deep wound)     Status: None (Preliminary result)   Collection Time: 04/28/21 10:47 AM   Specimen: PATH Soft tissue resection  Result Value Ref Range Status   Specimen Description ARM  Final   Special Requests LEFT ELBOW FRACTURE SITE SPEC C  Final   Gram Stain   Final    ABUNDANT WBC PRESENT,BOTH PMN AND MONONUCLEAR NO ORGANISMS SEEN Performed at [facility], [address], [place], [place] [address]    Culture   Final    FEW ENTEROBACTER HORMAECHEI NO ANAEROBES ISOLATED; CULTURE IN PROGRESS FOR 5 DAYS    Report Status PENDING  Incomplete   Organism ID, Bacteria ENTEROBACTER HORMAECHEI  Final      Susceptibility   Enterobacter hormaechei - MIC*    CEFAZOLIN >=64 RESISTANT Resistant     CEFEPIME <=0.12 SENSITIVE Sensitive     CEFTAZIDIME <=1 SENSITIVE Sensitive     CIPROFLOXACIN <=0.25 SENSITIVE Sensitive     GENTAMICIN <=1 SENSITIVE Sensitive     IMIPENEM <=0.25 SENSITIVE Sensitive     TRIMETH/SULFA <=20 SENSITIVE Sensitive     PIP/TAZO <=4 SENSITIVE Sensitive     * FEW ENTEROBACTER HORMAECHEI  Surgical pcr screen     Status: None   Collection Time: 04/29/21  9:53 PM   Specimen: Nasal Mucosa; Nasal Swab  Result Value Ref Range Status   MRSA, PCR NEGATIVE NEGATIVE Final   Staphylococcus aureus NEGATIVE NEGATIVE Final    Comment: (NOTE) The Xpert SA Assay (FDA approved for NASAL specimens in patients 36 years of age and older), is one component of a comprehensive  surveillance program. It is not intended to diagnose infection nor  to guide or monitor treatment. Performed at [facility], [place] [address], [place], [place] [address]     Janene Madeira, MSN, NP-C [facility] Pager: [phone] *Secure Chat Preferred   04/30/2021  12:14 PM

## 2021-04-30 NOTE — Progress Notes (Signed)
Ortho Trauma Note  Patient doing well.  Pain is well controlled.  Cultures growing Enterobacter.  Plan to proceed today for open reduction internal fixation of distal humerus with open reduction internal fixation of radial head versus possible radial head resection.  I discussed with him the need to get the radial head fixed and stabilized however if this is not possible we will perform resection to allow for pronation supination as well as flexion extension.  Risk and benefits were discussed with patient.  He agrees to proceed with surgery and consent was obtained.  Shona Needles, MD [facility] [phone] (office) [email or web address]

## 2021-04-30 NOTE — Progress Notes (Addendum)
OT Cancellation Note  Patient Details Name: Mark Powers MRN: [id number] DOB: 1970-08-09   Cancelled TreatmentReason Eval/Treat Not Completed: Patient at procedure or test/ unavailable; (pt in surgery, will follow up as schedule allows.)  Alfonzo Beers, OTD, OTR/L Acute Rehab [phone]   Mayer Masker 04/30/2021, 3:18 PM

## 2021-04-30 NOTE — Progress Notes (Signed)
Peripherally Inserted Central Catheter Placement  The IV Nurse has discussed with the patient and/or persons authorized to consent for the patient, the purpose of this procedure and the potential benefits and risks involved with this procedure.  The benefits include less needle sticks, lab draws from the catheter, and the patient may be discharged home with the catheter. Risks include, but not limited to, infection, bleeding, blood clot (thrombus formation), and puncture of an artery; nerve damage and irregular heartbeat and possibility to perform a PICC exchange if needed/ordered by physician.  Alternatives to this procedure were also discussed.  B[employer] Power PICC patient education guide, fact sheet on infection prevention and patient information card has been provided to patient /or left at bedside. PICC placfed by Burnard Bunting RN.   PICC Placement Documentation  PICC Single Lumen 04/30/21 Right Basilic 36 cm 0 cm (Active)  Indication for Insertion or Continuance of Line Home intravenous therapies (PICC only) 04/30/21 2144  Exposed Catheter (cm) 0 cm 04/30/21 2144  Site Assessment Clean;Dry;Intact 04/30/21 2144  Line Status Blood return noted;Flushed;Saline locked 04/30/21 2144  Dressing Type Transparent;Securing device 04/30/21 2144  Dressing Status Clean;Dry;Intact 04/30/21 2144  Antimicrobial disc in place? Yes 04/30/21 2144  Safety Lock Not Applicable 04/30/21 2144  Line Care Connections checked and tightened 04/30/21 2144  Line Adjustment (NICU/IV Team Only) No 04/30/21 2144  Dressing Intervention New dressing 04/30/21 2144  Dressing Change Due 05/07/21 04/30/21 2144       Christeen Douglas 04/30/2021, 9:49 PM

## 2021-04-30 NOTE — Anesthesia Procedure Notes (Signed)
Procedure Name: Intubation Date/Time: 04/30/2021 2:19 PM Performed by: Clearnce Sorrel, CRNA Pre-anesthesia Checklist: Patient identified, Emergency Drugs available, Suction available and Patient being monitored Patient Re-evaluated:Patient Re-evaluated prior to induction Oxygen Delivery Method: Circle System Utilized Preoxygenation: Pre-oxygenation with 100% oxygen Induction Type: IV induction Ventilation: Mask ventilation without difficulty Laryngoscope Size: Mac and 4 Grade View: Grade I Tube type: Oral Tube size: 7.5 mm Number of attempts: 1 Airway Equipment and Method: Stylet and Oral airway Placement Confirmation: ETT inserted through vocal cords under direct vision, positive ETCO2 and breath sounds checked- equal and bilateral Secured at: 24 cm Tube secured with: Tape Dental Injury: Teeth and Oropharynx as per pre-operative assessment

## 2021-04-30 NOTE — Anesthesia Preprocedure Evaluation (Addendum)
Anesthesia Evaluation  Patient identified by MRN, date of birth, ID band Patient awake    Reviewed: Allergy & Precautions, NPO status , Patient's Chart, lab work & pertinent test results  Airway Mallampati: II  TM Distance: >3 FB Neck ROM: Full    Dental no notable dental hx.    Pulmonary sleep apnea , Recent URI  (COVID ), Current Smoker and Patient abstained from smoking.,    Pulmonary exam normal breath sounds clear to auscultation       Cardiovascular Exercise Tolerance: Good hypertension, Pt. on medications Normal cardiovascular exam Rhythm:Regular Rate:Normal     Neuro/Psych negative neurological ROS  negative psych ROS   GI/Hepatic negative GI ROS, Neg liver ROS,   Endo/Other  diabetes, Well Controlled, Type 2  Renal/GU negative Renal ROS  negative genitourinary   Musculoskeletal negative musculoskeletal ROS (+)   Abdominal   Peds negative pediatric ROS (+)  Hematology  (+) anemia ,   Anesthesia Other Findings   Reproductive/Obstetrics negative OB ROS                            Anesthesia Physical Anesthesia Plan  ASA: 3  Anesthesia Plan: General   Post-op Pain Management: Tylenol PO (pre-op) and Ketamine IV   Induction: Intravenous  PONV Risk Score and Plan: 1 and Treatment may vary due to age or medical condition, Midazolam, Ondansetron and Dexamethasone  Airway Management Planned: Oral ETT  Additional Equipment: None  Intra-op Plan:   Post-operative Plan: Extubation in OR  Informed Consent: I have reviewed the patients History and Physical, chart, labs and discussed the procedure including the risks, benefits and alternatives for the proposed anesthesia with the patient or authorized representative who has indicated his/her understanding and acceptance.     Dental advisory given  Plan Discussed with: CRNA, Anesthesiologist and Surgeon  Anesthesia Plan  Comments:        Anesthesia Quick Evaluation

## 2021-04-30 NOTE — Op Note (Signed)
Orthopaedic Surgery Operative Note (CSN: [id number] ) Date of Surgery: 04/30/2021  Admit Date: 04/28/2021   Diagnoses: Pre-Op Diagnoses: Infected impending malunion left distal humerus Left radial head fracture/dislocation  Post-Op Diagnosis: Same  Procedures: CPT 24430-Open reduction internal fixation of left humeral mal/nonunion CPT 24666-Radial head excision left elbow CPT 24300-Left elbow manipulation  Surgeons : Primary: Roby Lofts, MD  Assistant: Ulyses Southward, PA-C  Location: OR 7   Anesthesia:General   Antibiotics: Scheduled IV antibiotics with topical vancomycin and tobramycin powder   Tourniquet time:None    Estimated Blood Loss:100 mL   Complications:None  Specimens:None  Implants: Implant Name Type Inv. Item Serial No. Manufacturer Lot No. LRB No. Used Action  SCREW CORTEX 3.5X24MM - [id number] Screw SCREW CORTEX 3.5X24MM  [employer]  Left 1 Implanted  SCREW CORT EVOS ST 3.5X28 - [id number] Screw SCREW CORT EVOS ST 3.5X28  [employer]  Left 2 Implanted  Ex Art-D-H plate l 82X    [employer]  Left 1 Implanted  SCREW CORT 3.5X30 ST EVOS - [id number] Screw SCREW CORT 3.5X30 ST EVOS  [employer]  Left 1 Implanted  SCREW CORTEX 3.5X26 - [id number] Screw SCREW CORTEX 3.5X26  [employer]  Left 5 Implanted  SCREW LOCK ST EVOS 2.7X20 - [id number] Screw SCREW LOCK ST EVOS 2.7X20  [employer]  Left 1 Implanted  SCREW LOCK 2.7X26 - [id number] Screw SCREW LOCK 2.7X26  [employer]  Left 2 Implanted  SCREW CORT 2.7X16 STAR T8 EVOS - [id number] Screw SCREW CORT 2.7X16 STAR T8 EVOS  [employer]  Left 1 Implanted  PLATE HUM EVOS 5H L 2.7X102 - [id number] Plate PLATE HUM EVOS 5H L 2.7X102  [employer]  Left 1 Implanted  SCREW CORT 3.5X22 ST EVOS - [id number] Screw SCREW CORT 3.5X22 ST EVOS  [employer]  Left 1  Implanted  SCREW BONE LOCKING 2.7 X 28 - [id number] Screw SCREW BONE LOCKING 2.7 X 28  [employer]  Left 1 Implanted  SCREW LOCK EVOS 2.7X32 - [id number] Screw SCREW LOCK EVOS 2.7X32  [employer]  Left 1 Implanted     Indications for Surgery: 51 year old male with a complex history for his left upper extremity.  Please see previous notes regarding full details.  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  He was indicated for return to the operating room for revision fixation of his nonunion/malunion with potential ORIF versus radial head excision for his left chronic radial head fracture dislocation.  Risks and benefits were discussed with the patient.  Risks include but not limited to bleeding, infection, malunion, nonunion, hardware failure, hardware irritation, nerve and blood vessel injury given the possibility of anesthetic complications.  He agreed to proceed with surgery and consent was obtained.  Operative Findings: 1.  Open reduction internal fixation of left supracondylar distal humerus nonunion using [employer] EVOS extra-articular distal humerus plate and direct medial plate 2.  Excision of radial head for subacute radial head fracture dislocation.  Significant limitation in forearm pronation and supination prior to excision and inability to open reduce the radial head to a satisfactory reduction 3.  Manipulation of left elbow with flexion as well as pronation and supination after fixation and excision of the radial head.  Procedure: The patient was identified in the preoperative holding area.  Consent was confirmed with the patient and their family and all questions were answered. The operative extremity was marked after confirmation with the patient. he was then brought back to the operating room by our anesthesia colleagues.  He was placed under general  anesthetic and carefully transferred over to radiolucent flat top table.  He was placed in the lateral decubitus position with the left side.  The left upper extremity was then prepped and draped in usual sterile fashion.  A timeout was performed to verify the patient, the procedure, and the extremity.  He had received his standard IV antibiotics for his osteomyelitis.  His incision and wound were reopened.  I encountered the fracture site once again.  It appeared clean without any signs of gross contamination or infection.  I performed a irrigation and debridement using pulsatile lavage and 3 L of normal saline.  I then changed gloves and instruments and turned my attention to fixation of the distal humerus.  A reduction was performed of the distal humerus and held provisionally with a reduction tenaculums.  I then chose a [employer] EVOS extra-articular distal humerus plate and placed this along the posterior aspect of the lateral portion of the distal humerus and humeral shaft.  I drilled and placed a nonlocking screws proximal and distal to the fracture.  There was some residual apex anterior deformity however I got good bony apposition posteriorly as well as anteriorly with a significant residual callus from the previous fixation.  Proceeded to place a mixture of locking and nonlocking screws distally and nonlocking screws into the humeral shaft.  I then proceeded to reflect off of the medial epicondyle and metaphysis soft tissue to expose a place for a medial plate.  I held it provisionally with a K wire and then proceeded to place nonlocking 3.5 millimeter screws above the fracture.  3 locking screws were placed distal to the fracture to provide reinforce fixation to the humeral nonunion.  At this point I was pleased with my fixation and fluoroscopic imaging was obtained.  I then turned my attention to the radial head fracture dislocation.  I developed a skin flap to expose the lateral aspect of the  elbow.  I then went to the standard Kocher interval between the extensor wad and the anconeus.  I then encountered the joint capsule and incised through this.  There is some fibrinous tissue and scar tissue that was excised to expose the calcified annular ligament and the remaining portion of the radial head.  An attempt was made to free up some of the soft tissue to be able to access the dislocated radial head and neck.  Unfortunately I was not able to access this with the radial head fragment still in the radiocapitellar joint.  I thought about performing a lateral condyle osteotomy and release of the lateral collateral ligament but I felt that this would devoid some of the blood supply to the distal humerus.  Attempts were made to manipulate the radial head back into the radiocapitellar joint but unfortunately this was unsuccessful.  At this point I felt that the optimal outcome would be a radial head excision.  I then resected the remaining articular fragment of the radial head and used a oscillating saw while carefully protecting the soft tissues to excise the dislocated portion of the radial head.  At this point I then used a freer elevator to release all of the soft tissue surrounding the remainder of the radial neck and I manipulated  the forearm in pronation and supination to try to regain some of the rotation of the forearm.  I then performed a manipulation of the elbow with forced flexion to regain some of the motion back.  Final fluoroscopic imaging was then obtained.  The incision was irrigated once more.  A gram of vancomycin powder 1.2 g of tobramycin powder were placed into the incision.  A layered closure of 0 PDS, 2-0 Monocryl and 3-0 nylon was used to close the skin.  Sterile dressing was placed.  The patient was then placed in a sling.  He was awoken from anesthesia and taken to the PACU in stable condition.  Post Op Plan/Instructions: The patient will be nonweightbearing to the left upper  extremity.  He will receive IV antibiotics for his osteomyelitis.  We will plan to have him work with OT tomorrow.  If we are able to get appropriate culture results and antibiotic regiment and his pain is well controlled he may be able to discharge tomorrow.  I was present and performed the entire surgery.  Ulyses Southward, PA-C did assist me throughout the case. An assistant was necessary given the difficulty in approach, maintenance of reduction and ability to instrument the fracture.   Truitt Merle, MD [facility]

## 2021-04-30 NOTE — Progress Notes (Signed)
PHARMACY CONSULT NOTE FOR:  OUTPATIENT  PARENTERAL ANTIBIOTIC THERAPY (OPAT)  Indication: Hardware associated osteomyelitis  Regimen: Cefepime 2 gm IV Q 8 hours End date: 05/20/2021  IV antibiotic discharge orders are pended. To discharging provider:  please sign these orders via discharge navigator,  Select New Orders & click on the button choice - Manage This Unsigned Work.     Thank you for allowing pharmacy to be a part of this patient's care.  Sharin Mons, PharmD, BCPS, BCIDP Infectious Diseases Clinical Pharmacist Phone: [phone] 04/30/2021, 4:22 PM

## 2021-04-30 NOTE — Interval H&P Note (Signed)
History and Physical Interval Note:  04/30/2021 1:06 PM  Mark Powers  has presented today for surgery, with the diagnosis of Left distal humerus nonunion.  The various methods of treatment have been discussed with the patient and family. After consideration of risks, benefits and other options for treatment, the patient has consented to  Procedure(s): OPEN REDUCTION INTERNAL FIXATION (ORIF) DISTAL HUMERUS FRACTURE (Left) OPEN REDUCTION INTERNAL FIXATION (ORIF) RADIAL HEAD FRACTURE (Left) as a surgical intervention.  The patient's history has been reviewed, patient examined, no change in status, stable for surgery.  I have reviewed the patient's chart and labs.  Questions were answered to the patient's satisfaction.     Lennette Bihari P Debrina Kizer

## 2021-05-01 ENCOUNTER — Encounter (HOSPITAL_COMMUNITY): Payer: Self-pay | Admitting: Student

## 2021-05-01 LAB — BASIC METABOLIC PANEL
Anion gap: 13 (ref 5–15)
Anion gap: 10 (ref 5–15)
BUN: 18 mg/dL (ref 6–20)
BUN: 16 mg/dL (ref 6–20)
CO2: 22 mmol/L (ref 22–32)
CO2: 28 mmol/L (ref 22–32)
Calcium: 8.8 mg/dL — ABNORMAL LOW (ref 8.9–10.3)
Calcium: 8.5 mg/dL — ABNORMAL LOW (ref 8.9–10.3)
Chloride: 101 mmol/L (ref 98–111)
Chloride: 98 mmol/L (ref 98–111)
Creatinine, Ser: 0.99 mg/dL (ref 0.61–1.24)
Creatinine, Ser: 0.9 mg/dL (ref 0.61–1.24)
GFR, Estimated: >60 mL/min (ref >60)
GFR, Estimated: >60 mL/min (ref >60)
Glucose, Bld: 216 mg/dL — ABNORMAL HIGH (ref 70–99)
Glucose, Bld: 186 mg/dL — ABNORMAL HIGH (ref 70–99)
Potassium: 5.9 mmol/L — ABNORMAL HIGH (ref 3.5–5.1)
Potassium: 3.5 mmol/L (ref 3.5–5.1)
Sodium: 136 mmol/L (ref 135–145)
Sodium: 136 mmol/L (ref 135–145)

## 2021-05-01 LAB — GLUCOSE, CAPILLARY
Glucose-Capillary: 335 mg/dL — ABNORMAL HIGH (ref 70–99)
Glucose-Capillary: 132 mg/dL — ABNORMAL HIGH (ref 70–99)

## 2021-05-01 LAB — CBC
HCT: 31.4 % — ABNORMAL LOW (ref 39.0–52.0)
Hemoglobin: 9.9 g/dL — ABNORMAL LOW (ref 13.0–17.0)
MCH: 28.3 pg (ref 26.0–34.0)
MCHC: 31.5 g/dL (ref 30.0–36.0)
MCV: 89.7 fL (ref 80.0–100.0)
Platelets: 390 10*3/uL (ref 150–400)
RBC: 3.5 MIL/uL — ABNORMAL LOW (ref 4.22–5.81)
RDW: 14.9 % (ref 11.5–15.5)
WBC: 9.1 10*3/uL (ref 4.0–10.5)
nRBC: 0 % (ref 0.0–0.2)

## 2021-05-01 MED ORDER — GABAPENTIN 300 MG PO CAPS: 300.0000 mg | ORAL_CAPSULE | Freq: Two times a day (BID) | ORAL | 0 refills | Status: AC

## 2021-05-01 MED ORDER — OXYCODONE HCL 10 MG PO TABS: 5.0000 mg | ORAL_TABLET | ORAL | 0 refills | Status: DC | PRN

## 2021-05-01 MED ORDER — HEPARIN SOD (PORK) LOCK FLUSH 100 UNIT/ML IV SOLN
250.0000 [IU] | INTRAVENOUS | Status: DC | PRN
  Filled 2021-05-01: qty 3

## 2021-05-01 MED ORDER — SODIUM CHLORIDE 0.9% FLUSH: 10.0000 mL | INTRAVENOUS | Status: DC | PRN

## 2021-05-01 MED ORDER — SODIUM CHLORIDE 0.9% FLUSH: 10.0000 mL | Freq: Two times a day (BID) | INTRAVENOUS | Status: DC

## 2021-05-01 MED ORDER — POLYETHYLENE GLYCOL 3350 17 G PO PACK: 17.0000 g | PACK | Freq: Every day | ORAL | 0 refills | Status: DC | PRN

## 2021-05-01 MED ORDER — CEFEPIME IV (FOR PTA / DISCHARGE USE ONLY): 2.0000 g | Freq: Three times a day (TID) | INTRAVENOUS | 0 refills | Status: AC

## 2021-05-01 MED ORDER — HEPARIN SOD (PORK) LOCK FLUSH 100 UNIT/ML IV SOLN
250.0000 [IU] | INTRAVENOUS | Status: AC | PRN
  Administered 2021-05-01: 250 [IU]
  Filled 2021-05-01: qty 2.5

## 2021-05-01 MED ORDER — SORBITOL 70 % SOLN
30.0000 mL | Freq: Once | Status: DC
  Filled 2021-05-01: qty 30

## 2021-05-01 MED ORDER — CYCLOBENZAPRINE HCL 10 MG PO TABS: 10.0000 mg | ORAL_TABLET | Freq: Three times a day (TID) | ORAL | 0 refills | Status: AC | PRN

## 2021-05-01 MED ORDER — OXYCODONE HCL 5 MG PO TABS
5.0000 mg | ORAL_TABLET | ORAL | 0 refills | Status: AC | PRN
Start: 2021-05-01 — End: ?

## 2021-05-01 MED ORDER — ASPIRIN 81 MG PO TBEC: 81.0000 mg | DELAYED_RELEASE_TABLET | Freq: Every day | ORAL | Status: AC

## 2021-05-01 NOTE — Progress Notes (Signed)
Occupational Therapy Treatment Patient Details Name: Mark Powers MRN: [id number] DOB: 05-30-1970 Today's Date: 05/01/2021   History of present illness Pt is a 51 y.o. male admitted 04/28/21 with multiple weeks of persistent drainage at LUE ORIF site with concern for infection and sepsis. S/p decompression L ulnar nerve, L humerus fx I&D with removal of hardware on 1/30. Returned to OR for revision fixation of distal humerus and surgical fixation of radial head on 2/1. PMH includes HTN, DM, OSA, anemia, COVID (03/2020).   OT comments  Pt seen for first OT session s/p procedure involving L non-dominant UE. Pt reports pain well controlled though increased difficulty with shoulder movement today and continued digit sensation deficits. Pt able to make a light fist and oppose digits with slow coordination. Provided squeeze ball to assist with grip strength. Pt reports able to manage dressing tasks, mobility and toileting tasks without assist. Educated re: compensatory strategies for ADLs/IADLs at home, continued ROM of hand/shoulder, coordination with surgeon on elbow ROM recs, elevation of affected extremity, avoiding prolonged dependent position in standing (no longer has sling), and avoiding heavy lifting. Pt functionally appropriate for DC home whenever cleared by MD. Pt would benefit from OP therapy follow-up pending surgeon's recommendation for L UE progression.    Recommendations for follow up therapy are one component of a multi-disciplinary discharge planning process, led by the attending physician.  Recommendations may be updated based on patient status, additional functional criteria and insurance authorization.    Follow Up Recommendations  Follow physician's recommendations for discharge plan and follow up therapies    Assistance Recommended at Discharge PRN  Patient can return home with the following  Assistance with cooking/housework;Assist for transportation   Equipment Recommendations   None recommended by OT    Recommendations for Other Services      Precautions / Restrictions Precautions Precautions: Fall Precaution Comments: 1/31 per PA: pt ok for hand and shoulder ROM as tolerated. Large ace wrap around elbow Restrictions Weight Bearing Restrictions: Yes LUE Weight Bearing: Non weight bearing       Mobility Bed Mobility Overal bed mobility: Independent                  Transfers Overall transfer level: Independent Equipment used: None                     Balance Overall balance assessment: Independent                                         ADL either performed or assessed with clinical judgement   ADL Overall ADL's : Needs assistance/impaired                                       General ADL Comments: Educated on compensatory strategies for ADLs (donning affected UE first, bathing/showering precautions), avoiding heavy lifting, considering assist for heavier IADLs. Pt reports able to don cotton tanktop without assist, manage shorts and socks with increased tiime though still no assist. Has been going to bathroom independently.    Extremity/Trunk Assessment Upper Extremity Assessment Upper Extremity Assessment: LUE deficits/detail LUE Deficits / Details: bandaged from mid hand to mid bicep. Large ace wrap around elbow limiting ROM. Decreased sensation primarily in 5th digit. able to make light fist (provided squeeze ball to  improve ROM/strength). able to abduct to 90*. increased shldr flexion difficulty (10* actively. 80* passively) LUE Sensation: decreased light touch LUE Coordination: decreased fine motor;decreased gross motor   Lower Extremity Assessment Lower Extremity Assessment: Overall WFL for tasks assessed        Vision   Vision Assessment?: No apparent visual deficits   Perception     Praxis      Cognition Arousal/Alertness: Awake/alert Behavior During Therapy: WFL for tasks  assessed/performed Overall Cognitive Status: Within Functional Limits for tasks assessed                                          Exercises Exercises: Other exercises Other Exercises Other Exercises: squeeze ball x 5    Shoulder Instructions       General Comments      Pertinent Vitals/ Pain       Pain Assessment Pain Assessment: Faces Faces Pain Scale: Hurts little more Pain Location: with L shoulder flexion Pain Descriptors / Indicators: Discomfort, Sore, Guarding Pain Intervention(s): Monitored during session  Home Living                                          Prior Functioning/Environment              Frequency  Min 2X/week        Progress Toward Goals  OT Goals(current goals can now be found in the care plan section)  Progress towards OT goals: Progressing toward goals  Acute Rehab OT Goals Patient Stated Goal: go home today OT Goal Formulation: With patient Time For Goal Achievement: 05/13/21 Potential to Achieve Goals: Good  Plan Discharge plan remains appropriate;Frequency needs to be updated    Co-evaluation                 AM-PAC OT "6 Clicks" Daily Activity     Outcome Measure   Help from another person eating meals?: None Help from another person taking care of personal grooming?: None Help from another person toileting, which includes using toliet, bedpan, or urinal?: None Help from another person bathing (including washing, rinsing, drying)?: A Little Help from another person to put on and taking off regular upper body clothing?: None Help from another person to put on and taking off regular lower body clothing?: None 6 Click Score: 23    End of Session    OT Visit Diagnosis: Muscle weakness (generalized) (M62.81)   Activity Tolerance Patient tolerated treatment well   Patient Left in bed;with call bell/phone within reach;with nursing/sitter in room   Nurse Communication           Time: [id number] OT Time Calculation (min): 13 min  Charges: OT General Charges $OT Visit: 1 Visit OT Treatments $Self Care/Home Management : 8-22 mins  Malachy Chamber, OTR/L Acute Rehab Services Office: [phone]   Layla Maw 05/01/2021, 11:07 AM

## 2021-05-01 NOTE — Discharge Instructions (Signed)
[facility] Discharge Instructions   General Discharge Instructions  WEIGHT BEARING STATUS: Non-weightbearing left upper extremity  RANGE OF MOTION/ACTIVITY: ok for elbow, shoulder, wrist motion as tolerated.   Wound Care: You may remove your surgical dressing on post-op day #2 (Friday 05/02/21). Incisions can be left open to air if there is no drainage. If incision continues to have drainage, follow wound care instructions below. Okay to shower if no drainage from incisions.  DVT/PE prophylaxis: Aspirin  Diet: as you were eating previously.  Can use over the counter stool softeners and bowel preparations, such as Miralax, to help with bowel movements.  Narcotics can be constipating.  Be sure to drink plenty of fluids  PAIN MEDICATION USE AND EXPECTATIONS  You have likely been given narcotic medications to help control your pain.  After a traumatic event that results in an fracture (broken bone) with or without surgery, it is ok to use narcotic pain medications to help control one's pain.  We understand that everyone responds to pain differently and each individual patient will be evaluated on a regular basis for the continued need for narcotic medications. Ideally, narcotic medication use should last no more than 6-8 weeks (coinciding with fracture healing).   As a patient it is your responsibility as well to monitor narcotic medication use and report the amount and frequency you use these medications when you come to your office visit.   We would also advise that if you are using narcotic medications, you should take a dose prior to therapy to maximize you participation.  IF YOU ARE ON NARCOTIC MEDICATIONS IT IS NOT PERMISSIBLE TO OPERATE A MOTOR VEHICLE (MOTORCYCLE/CAR/TRUCK/MOPED) OR HEAVY MACHINERY DO NOT MIX NARCOTICS WITH OTHER CNS (CENTRAL NERVOUS SYSTEM) DEPRESSANTS SUCH AS ALCOHOL   STOP SMOKING OR USING NICOTINE PRODUCTS!!!!  As discussed nicotine severely impairs  your body's ability to heal surgical and traumatic wounds but also impairs bone healing.  Wounds and bone heal by forming microscopic blood vessels (angiogenesis) and nicotine is a vasoconstrictor (essentially, shrinks blood vessels).  Therefore, if vasoconstriction occurs to these microscopic blood vessels they essentially disappear and are unable to deliver necessary nutrients to the healing tissue.  This is one modifiable factor that you can do to dramatically increase your chances of healing your injury.    (This means no smoking, no nicotine gum, patches, etc)  DO NOT USE NONSTEROIDAL ANTI-INFLAMMATORY DRUGS (NSAID'S)  Using products such as Advil (ibuprofen), Aleve (naproxen), Motrin (ibuprofen) for additional pain control during fracture healing can delay and/or prevent the healing response.  If you would like to take over the counter (OTC) medication, Tylenol (acetaminophen) is ok.  However, some narcotic medications that are given for pain control contain acetaminophen as well. Therefore, you should not exceed more than 4000 mg of tylenol in a day if you do not have liver disease.  Also note that there are may OTC medicines, such as cold medicines and allergy medicines that my contain tylenol as well.  If you have any questions about medications and/or interactions please ask your doctor/PA or your pharmacist.      ICE AND ELEVATE INJURED/OPERATIVE EXTREMITY  Using ice and elevating the injured extremity above your heart can help with swelling and pain control.  Icing in a pulsatile fashion, such as 20 minutes on and 20 minutes off, can be followed.    Do not place ice directly on skin. Make sure there is a barrier between to skin and the ice pack.  Using frozen items such as frozen peas works well as the conform nicely to the are that needs to be iced.  USE AN ACE WRAP OR TED HOSE FOR SWELLING CONTROL  In addition to icing and elevation, Ace wraps or TED hose are used to help limit and  resolve swelling.  It is recommended to use Ace wraps or TED hose until you are informed to stop.    When using Ace Wraps start the wrapping distally (farthest away from the body) and wrap proximally (closer to the body)   Example: If you had surgery on your leg or thing and you do not have a splint on, start the ace wrap at the toes and work your way up to the thigh        If you had surgery on your upper extremity and do not have a splint on, start the ace wrap at your fingers and work your way up to the upper arm   CALL THE OFFICE WITH ANY QUESTIONS OR CONCERNS: [phone]   VISIT OUR WEBSITE FOR ADDITIONAL INFORMATION: [email or web address]     Discharge Wound Care Instructions  Do NOT apply any ointments, solutions or lotions to pin sites or surgical wounds.  These prevent needed drainage and even though solutions like hydrogen peroxide kill bacteria, they also damage cells lining the pin sites that help fight infection.  Applying lotions or ointments can keep the wounds moist and can cause them to breakdown and open up as well. This can increase the risk for infection. When in doubt call the office.  If any drainage is noted, use one layer of adaptic, then gauze, Kerlix, and an ace wrap.  Once the incision is completely dry and without drainage, it may be left open to air out.  Showering may begin 36-48 hours later.  Cleaning gently with soap and water.
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
## 2021-05-01 NOTE — Anesthesia Postprocedure Evaluation (Signed)
Anesthesia Post Note  Patient: Mark Powers  Procedure(s) Performed: IRRIGATION AND DEBRIDEMENT EXTREMITY (Left: Elbow) HARDWARE  REMOVAL LEFT ELBOW (Left: Elbow)     Patient location during evaluation: PACU Anesthesia Type: General Level of consciousness: awake and alert Pain management: pain level controlled Vital Signs Assessment: post-procedure vital signs reviewed and stable Respiratory status: spontaneous breathing, nonlabored ventilation, respiratory function stable and patient connected to nasal cannula oxygen Cardiovascular status: blood pressure returned to baseline and stable Postop Assessment: no apparent nausea or vomiting Anesthetic complications: no   No notable events documented.  Last Vitals:  Vitals:   05/01/21 0517 05/01/21 0727  BP: 130/75 (!) 146/71  Pulse: 86 96  Resp: 18 17  Temp: 36.6 C 36.8 C  SpO2: 97% 100%    Last Pain:  Vitals:   05/01/21 0830  TempSrc:   PainSc: 5                  Khaleah Duer

## 2021-05-01 NOTE — Progress Notes (Signed)
Pt educated on AVS with spouse, Carollee Herter at [facility].  Pam from [facility] did family teaching with administration of home IV antibiotics.  IV team flushed single lumen Picc per policy.  Picc line DDI and will remain intact for pt at discharge per orders.

## 2021-05-01 NOTE — TOC Initial Note (Signed)
Transition of Care [facility]) - Initial/Assessment Note    Patient Details  Name: Mark Powers MRN: [id number] Date of Birth: [birth year]  Transition of Care [facility]) CM/SW Contact:    Epifanio Lesches, RN Phone Number: 05/01/2021, 1:25 PM  Clinical Narrative:                 NCM spoke with pt regarding d/c planning.      - S/p decompression L ulnar nerve, L humerus fx I&D with removal of hardware on 1/30, s/p  revision fixation of distal humerus and surgical fixation of radial head on 2/1 From home with wife. Wife is a L[PN. Pt states PTA independent with ADL's, no DME usage. Per ID pt will require 2-3 weeks IV ABX therapy. Pt is aware and agreeable to home health services. Pt  without preference . Referral made with [facility] and [facility] and accepted. Pam with [facility] will provide teaching @ pt's bedside prior to d/c.  [facility] team monitoring and will assist with needs....  Expected Discharge Plan: Home w Home Health Services Barriers to Discharge: Continued Medical Work up   Patient Goals and CMS Choice     Choice offered to / list presented to : Patient  Expected Discharge Plan and Services Expected Discharge Plan: Home w Home Health Services   Discharge Planning Services: CM Consult   Living arrangements for the past 2 months: Single Family Home                 DME Arranged: Other see comment (IV ABX therapy) DME Agency: [employer] Date DME Agency Contacted: 05/01/21 Time DME Agency Contacted: [phone] Representative spoke with at DME Agency: Pam HH Arranged: RN          Prior Living Arrangements/Services Living arrangements for the past 2 months: Single Family Home Lives with:: Spouse Patient language and need for interpreter reviewed:: Yes Do you feel safe going back to the place where you live?: Yes      Need for Family Participation in Patient Care: Yes (Comment) Care giver support system in place?: Yes (comment)   Criminal  Activity/Legal Involvement Pertinent to Current Situation/Hospitalization: No - Comment as needed  Activities of Daily Living Home Assistive Devices/Equipment: Blood pressure cuff, CBG Meter ADL Screening (condition at time of admission) Patient's cognitive ability adequate to safely complete daily activities?: Yes Is the patient deaf or have difficulty hearing?: No Does the patient have difficulty seeing, even when wearing glasses/contacts?: No Does the patient have difficulty concentrating, remembering, or making decisions?: No Patient able to express need for assistance with ADLs?: Yes Does the patient have difficulty dressing or bathing?: No Independently performs ADLs?: Yes (appropriate for developmental age) Does the patient have difficulty walking or climbing stairs?: No Weakness of Legs: None Weakness of Arms/Hands: Left  Permission Sought/Granted   Permission granted to share information with : Yes, Verbal Permission Granted  Share Information with NAME: Mattson Dayal (Spouse)  [phone]           Emotional Assessment Appearance:: Appears stated age Attitude/Demeanor/Rapport: Gracious Affect (typically observed): Accepting Orientation: : Oriented to Self, Oriented to Place, Oriented to  Time, Oriented to Situation Alcohol / Substance Use: Not Applicable Psych Involvement: No (comment)  Admission diagnosis:  Left supracondylar humerus fracture, with delayed healing, subsequent encounter [S42.412G] Patient Active Problem List   Diagnosis Date Noted   Left radial head fracture 04/28/2021   Internal orthopedic device with infection or inflammatory reaction (HCC) 04/28/2021  Left supracondylar humerus fracture, with delayed healing, subsequent encounter 04/27/2021   PCP:  Charlott Rakes, MD Pharmacy:   [facility] [address] [place] - [address] [place] [address] Phone: [phone] Fax: [phone]     Social  Determinants of Health (SDOH) Interventions    Readmission Risk Interventions No flowsheet data found.

## 2021-05-01 NOTE — Anesthesia Postprocedure Evaluation (Signed)
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  Procedure(Powers) Performed: OPEN REDUCTION INTERNAL FIXATION (ORIF) DISTAL HUMERUS FRACTURE (Left) OPEN REDUCTION INTERNAL FIXATION (ORIF) RADIAL HEAD FRACTURE (Left)     Patient location during evaluation: PACU Anesthesia Type: General Level of consciousness: awake and alert Pain management: pain level controlled Vital Signs Assessment: post-procedure vital signs reviewed and stable Respiratory status: spontaneous breathing, nonlabored ventilation, respiratory function stable and patient connected to nasal cannula oxygen Cardiovascular status: blood pressure returned to baseline and stable Postop Assessment: no apparent nausea or vomiting Anesthetic complications: no   No notable events documented.  Last Vitals:  Vitals:   04/30/21 2338 05/01/21 0517  BP: 132/74 130/75  Pulse: 98 86  Resp: 17 18  Temp: 36.8 C 36.6 C  SpO2: 94% 97%    Last Pain:  Vitals:   04/30/21 2136  TempSrc:   PainSc: 5                  Mark Powers

## 2021-05-01 NOTE — Progress Notes (Signed)
Orthopaedic Trauma Progress Note  SUBJECTIVE: Doing fairly well today.  Pain currently controlled.  No chest pain. No SOB. No nausea/vomiting.  States arm feels better and ulnar nerve pain has improved some from time of admission.  Still having a hard time straightening/extending his fingers.  No other issues of note.  PICC line placed yesterday.  Feels ready to go home today.  No other specific concerns or complaints currently.  No BM yet.  BMP drawn this morning showed K 5.9.  There was slight hemolysis noted.  Patient appears asymptomatic.  OBJECTIVE:  Vitals:   05/01/21 0517 05/01/21 0727  BP: 130/75 (!) 146/71  Pulse: 86 96  Resp: 18 17  Temp: 97.8 F (36.6 C) 98.2 F (36.8 C)  SpO2: 97% 100%    General: Sitting up on edge of bed, no acute distress Respiratory: No increased work of breathing.  LUE: Dressing clean, dry, intact.  Tender about the distal humerus and elbow as expected.  Able to wiggle fingers but has weakness with active extension of the thumb, fingers, wrist.   Passively he is able to get fingers fully extended.  Weak grip strength compared to contralateral side.  Limited elbow motion in all directions.  Decreased sensation through the ulnar nerve distribution.  Otherwise sensation intact throughout the hand. Hand warm and well-perfused.  IMAGING: Stable post op imaging.   LABS:  Results for orders placed or performed during the hospital encounter of 04/28/21 (from the past 24 hour(s))  Glucose, capillary     Status: Abnormal   Collection Time: 04/30/21  5:41 PM  Result Value Ref Range   Glucose-Capillary 143 (H) 70 - 99 mg/dL  Glucose, capillary     Status: Abnormal   Collection Time: 04/30/21  6:52 PM  Result Value Ref Range   Glucose-Capillary 169 (H) 70 - 99 mg/dL  Glucose, capillary     Status: Abnormal   Collection Time: 04/30/21  7:20 PM  Result Value Ref Range   Glucose-Capillary 143 (H) 70 - 99 mg/dL  Basic metabolic panel     Status: Abnormal    Collection Time: 05/01/21  2:36 AM  Result Value Ref Range   Sodium 136 135 - 145 mmol/L   Potassium 5.9 (H) 3.5 - 5.1 mmol/L   Chloride 101 98 - 111 mmol/L   CO2 22 22 - 32 mmol/L   Glucose, Bld 216 (H) 70 - 99 mg/dL   BUN 18 6 - 20 mg/dL   Creatinine, Ser 7.51 0.61 - 1.24 mg/dL   Calcium 8.8 (L) 8.9 - 10.3 mg/dL   GFR, Estimated >70 >01 mL/min   Anion gap 13 5 - 15  Glucose, capillary     Status: Abnormal   Collection Time: 05/01/21  7:29 AM  Result Value Ref Range   Glucose-Capillary 335 (H) 70 - 99 mg/dL   Comment 1 QC Due   CBC     Status: Abnormal   Collection Time: 05/01/21  7:50 AM  Result Value Ref Range   WBC 9.1 4.0 - 10.5 K/uL   RBC 3.50 (L) 4.22 - 5.81 MIL/uL   Hemoglobin 9.9 (L) 13.0 - 17.0 g/dL   HCT 74.9 (L) 44.9 - 67.5 %   MCV 89.7 80.0 - 100.0 fL   MCH 28.3 26.0 - 34.0 pg   MCHC 31.5 30.0 - 36.0 g/dL   RDW 91.6 38.4 - 66.5 %   Platelets 390 150 - 400 K/uL   nRBC 0.0 0.0 - 0.2 %  Glucose,  capillary     Status: Abnormal   Collection Time: 05/01/21 11:16 AM  Result Value Ref Range   Glucose-Capillary 132 (H) 70 - 99 mg/dL    ASSESSMENT: Mark Powers is a 51 y.o. male, 1 Day Post-Op s/p IRRIGATION AND DEBRIDEMENT EXTREMITY HARDWARE  REMOVAL LEFT ELBOW  CV/Blood loss: Acute blood loss anemia, Hgb 9.9 this a.m. Hemodynamically stable  PLAN: Weightbearing: NWB LUE Incisional and dressing care: Patient may remove dressings to left upper extremity tomorrow  Showering: Okay to begin showering get incisions wet on 05/03/2021 Orthopedic device(s): Sling for comfort Pain management:  1. Tylenol 1000 mg q 6 hours scheduled 2. Flexeril 10 mg TID PRN 3. Oxycodone 5-15 mg q 4 hours PRN 4. Neurontin 300 mg BID 5. Dilaudid 0.5-1 mg q 4 hours PRN 6. Celebrex 200 mg BID VTE prophylaxis: Lovenox, SCDs ID: Cefepime per ID Foley/Lines:  No foley, KVO IVFs Impediments to Fracture Healing: Infection.  Vitamin D level 34, no supplementation needed. Dispo: Continue  with therapies as tolerated while inpatient.  Patient will continue with IV antibiotics x2 weeks at discharge before transitioning to oral antibiotics.  Recheck BMP today at 1300.  We will give sorbitol for constipation.  Discharge home later today versus tomorrow pending repeat BMP.  Plan to discharge on aspirin.  Follow - up plan: 2 weeks after discharge for repeat x-rays and wound check  Contact information:  Truitt Merle MD, Thyra Breed PA-C. After hours and holidays please check [email or web address] for group call information for [facility]   Thompson Caul, PA-C [phone] (office) [email or web address]

## 2021-05-03 LAB — AEROBIC/ANAEROBIC CULTURE W GRAM STAIN (SURGICAL/DEEP WOUND): Gram Stain: NONE SEEN

## 2021-05-20 ENCOUNTER — Encounter: Payer: Self-pay | Admitting: Internal Medicine

## 2021-05-20 ENCOUNTER — Ambulatory Visit (INDEPENDENT_AMBULATORY_CARE_PROVIDER_SITE_OTHER): Payer: PRIVATE HEALTH INSURANCE | Admitting: Internal Medicine

## 2021-05-20 ENCOUNTER — Other Ambulatory Visit: Payer: Self-pay

## 2021-05-20 ENCOUNTER — Telehealth: Payer: Self-pay

## 2021-05-20 VITALS — BP 131/83 | HR 97 | Temp 98.4°F

## 2021-05-20 DIAGNOSIS — Z5181 Encounter for therapeutic drug level monitoring: Secondary | ICD-10-CM

## 2021-05-20 DIAGNOSIS — Z452 Encounter for adjustment and management of vascular access device: Secondary | ICD-10-CM

## 2021-05-20 DIAGNOSIS — T847XXA Infection and inflammatory reaction due to other internal orthopedic prosthetic devices, implants and grafts, initial encounter: Secondary | ICD-10-CM

## 2021-05-20 MED ORDER — SULFAMETHOXAZOLE-TRIMETHOPRIM 800-160 MG PO TABS: 2.0000 | ORAL_TABLET | Freq: Two times a day (BID) | ORAL | 0 refills | Status: AC

## 2021-05-20 NOTE — Telephone Encounter (Signed)
Thank you :)

## 2021-05-20 NOTE — Assessment & Plan Note (Signed)
Clinically healing well and ESR trending down with stable CRP.  No new concerns.  At this point, will transition him to high dose oral Bactrim and schedule labs with [facility] for monitoring ESR, CRP, creat, K.

## 2021-05-20 NOTE — Telephone Encounter (Signed)
Per Md called [facility] with Verbal order to pull patient's picc out on 2/22. Spoke with Amy who was able to take order.  Juanita Laster, RMA

## 2021-05-20 NOTE — Progress Notes (Signed)
° °  Subjective:    Patient ID: Mark Powers, male    DOB: 1970/07/29, 52 y.o.   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   His cultures grew Enterobacter hormaechei in multiple cultures and he was placed on cefepime with plan for 4-6 weeks of antibiotic treatment if he does well.  He has been on cefepime about 3 weeks and doing well.  Labs reassuring with an ESR trending down to 65 and CRP remaining at 40.  Elbow improving and no new concerns. No associated rash or diarrhea. Seeing Dr. Doreatha Martin today.     Review of Systems  Constitutional:  Negative for chills, fever and unexpected weight change.  Gastrointestinal:  Negative for diarrhea and nausea.  Skin:  Negative for rash.      Objective:   Physical Exam Eyes:     General: No scleral icterus. Musculoskeletal:     Comments: Left elbow with no erythema, well-healed incision with sutures in place, no drainage, no significant warmth.  Improving movement.   Neurological:     Mental Status: He is alert.   SH: + tobacco       Assessment & Plan:

## 2021-05-20 NOTE — Assessment & Plan Note (Signed)
No issues with CBC, cmp.  Will continue to monitor his BMP on Bactrim high dose and this has been scheduled with [facility].

## 2021-05-20 NOTE — Assessment & Plan Note (Signed)
Doing well, no concerns with the picc.  Have informed home health to remove tomorrow.

## 2021-06-03 ENCOUNTER — Other Ambulatory Visit: Payer: Self-pay | Admitting: Student

## 2021-06-03 DIAGNOSIS — M12811 Other specific arthropathies, not elsewhere classified, right shoulder: Secondary | ICD-10-CM

## 2021-06-05 ENCOUNTER — Telehealth: Payer: Self-pay

## 2021-06-05 NOTE — Telephone Encounter (Signed)
Appointment changed and patient's wife updated that appointment will be virtual.  ?

## 2021-06-05 NOTE — Telephone Encounter (Signed)
Patient's wife is calling asking if patient's appointment for Friday can be a virtual appointment. Patient does not want to miss anymore days from work. Patient also having labs done at [facility] near him.  ?Please advise ?

## 2021-06-06 ENCOUNTER — Encounter: Payer: Self-pay | Admitting: Internal Medicine

## 2021-06-06 ENCOUNTER — Telehealth (INDEPENDENT_AMBULATORY_CARE_PROVIDER_SITE_OTHER): Payer: PRIVATE HEALTH INSURANCE | Admitting: Internal Medicine

## 2021-06-06 ENCOUNTER — Other Ambulatory Visit: Payer: Self-pay

## 2021-06-06 DIAGNOSIS — T847XXA Infection and inflammatory reaction due to other internal orthopedic prosthetic devices, implants and grafts, initial encounter: Secondary | ICD-10-CM

## 2021-06-06 DIAGNOSIS — Z452 Encounter for adjustment and management of vascular access device: Secondary | ICD-10-CM | POA: Diagnosis not present

## 2021-06-06 NOTE — Progress Notes (Signed)
? ?  Subjective:  ? ?I connected with  Mark Powers on 06/06/21 by a video enabled telemedicine application and verified that I am speaking with the correct person using two identifiers. ?  ?I discussed the limitations of evaluation and management by telemedicine. The patient expressed understanding and agreed to proceed. ? ?Location: ?Patient - home ?Physician - clinic ? ?Duration of visit:  15 minutes ? ? Patient ID: Mark Powers, male    DOB: February 16, 1971, 51 y.o.   MRN: [id number] ? 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 Doreatha Martin on 04/30/21.  His culture grew Enterobacter hormaechei in multiple cultures and treated initially with cefepime for 3 weeks and has been on Bactrim since then, now about 2 weeks.  He had labs this am and his creat is 0.8, ESR 65, down from 85, CRP stable at 40.  His elbow is doing well and now in physical therapy and improving.  He has no concerns today.  On the visit with his wife as well.  No rash or diarrhea.  ? ? ?Review of Systems  ?Constitutional:  Negative for chills and fever.  ?Gastrointestinal:  Negative for diarrhea and nausea.  ?Skin:  Negative for rash.  ? ?   ?Objective:  ? Physical Exam ?Neurological:  ?   Mental Status: He is alert.  ?Psychiatric:     ?   Mood and Affect: Mood normal.  ? ? ? ? ? ?   ?Assessment & Plan:  ? ? ?

## 2021-06-06 NOTE — Assessment & Plan Note (Signed)
His infection appears resolved clinically and no concerns on the inflammatory markers.  At this point, he can continue with the antibiotics through Monday, 3/13 and then stop and observe off of antibiotics. ?He otherwise will follow up as needed.   ?

## 2021-06-06 NOTE — Assessment & Plan Note (Signed)
This has been removed and no issues at the site.  ?

## 2021-06-21 ENCOUNTER — Ambulatory Visit
Admission: RE | Admit: 2021-06-21 | Discharge: 2021-06-21 | Disposition: A | Discharge status: Home / Self Care | Payer: PRIVATE HEALTH INSURANCE | Source: Ambulatory Visit | Attending: Student | Admitting: Student

## 2021-06-21 ENCOUNTER — Other Ambulatory Visit: Payer: Self-pay

## 2021-06-21 DIAGNOSIS — M12811 Other specific arthropathies, not elsewhere classified, right shoulder: Secondary | ICD-10-CM

## 2021-07-09 ENCOUNTER — Encounter: Payer: Self-pay | Admitting: Orthopedic Surgery

## 2021-07-09 ENCOUNTER — Ambulatory Visit: Payer: No Typology Code available for payment source | Admitting: Orthopedic Surgery

## 2021-07-09 DIAGNOSIS — M7502 Adhesive capsulitis of left shoulder: Secondary | ICD-10-CM
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
## 2021-08-20 ENCOUNTER — Ambulatory Visit: Payer: No Typology Code available for payment source | Admitting: Orthopedic Surgery

## 2021-09-17 ENCOUNTER — Ambulatory Visit: Payer: No Typology Code available for payment source | Admitting: Orthopedic Surgery
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
